# Patient Record
Sex: Female | Born: 1940 | Race: White | Hispanic: No | Marital: Married | Smoking: Current every day smoker
Health system: Southern US, Community
[De-identification: ages and names within clinical notes are randomized; demographics above are authoritative.]

## PROBLEM LIST (undated history)

## (undated) DIAGNOSIS — J449 Chronic obstructive pulmonary disease, unspecified: Secondary | ICD-10-CM

## (undated) DIAGNOSIS — I10 Essential (primary) hypertension: Secondary | ICD-10-CM

## (undated) DIAGNOSIS — M1990 Unspecified osteoarthritis, unspecified site: Secondary | ICD-10-CM

## (undated) DIAGNOSIS — E785 Hyperlipidemia, unspecified: Secondary | ICD-10-CM

## (undated) DIAGNOSIS — J45909 Unspecified asthma, uncomplicated: Secondary | ICD-10-CM

## (undated) HISTORY — PX: BACK SURGERY: SHX140

## (undated) HISTORY — PX: NECK SURGERY: SHX720

## (undated) HISTORY — PX: CHOLECYSTECTOMY: SHX55

## (undated) HISTORY — DX: Unspecified asthma, uncomplicated: J45.909

## (undated) HISTORY — PX: ABDOMINAL HYSTERECTOMY: SHX81

## (undated) HISTORY — DX: Hyperlipidemia, unspecified: E78.5

## (undated) HISTORY — PX: APPENDECTOMY: SHX54

## (undated) HISTORY — DX: Essential (primary) hypertension: I10

---

## 2004-03-24 ENCOUNTER — Emergency Department: Payer: Self-pay | Admitting: Emergency Medicine

## 2004-08-09 ENCOUNTER — Ambulatory Visit: Payer: Self-pay | Admitting: Family Medicine

## 2004-11-04 ENCOUNTER — Ambulatory Visit: Payer: Self-pay | Admitting: Family Medicine

## 2005-05-25 ENCOUNTER — Other Ambulatory Visit: Payer: Self-pay

## 2005-05-25 ENCOUNTER — Ambulatory Visit: Payer: Self-pay | Admitting: General Surgery

## 2005-05-29 ENCOUNTER — Ambulatory Visit: Payer: Self-pay | Admitting: General Surgery

## 2005-12-29 ENCOUNTER — Other Ambulatory Visit: Payer: Self-pay

## 2005-12-29 ENCOUNTER — Inpatient Hospital Stay: Payer: Self-pay | Admitting: Internal Medicine

## 2005-12-30 ENCOUNTER — Other Ambulatory Visit: Payer: Self-pay

## 2006-03-12 ENCOUNTER — Ambulatory Visit: Payer: Self-pay | Admitting: Ophthalmology

## 2006-03-19 ENCOUNTER — Ambulatory Visit: Payer: Self-pay | Admitting: Ophthalmology

## 2006-06-07 ENCOUNTER — Ambulatory Visit: Payer: Self-pay | Admitting: Family Medicine

## 2006-09-17 ENCOUNTER — Ambulatory Visit: Payer: Self-pay | Admitting: Family Medicine

## 2007-09-19 ENCOUNTER — Ambulatory Visit: Payer: Self-pay | Admitting: Family Medicine

## 2008-05-10 ENCOUNTER — Emergency Department: Payer: Self-pay

## 2008-05-25 ENCOUNTER — Ambulatory Visit: Payer: Self-pay | Admitting: Family Medicine

## 2009-05-06 ENCOUNTER — Ambulatory Visit: Payer: Self-pay | Admitting: Otolaryngology

## 2009-09-23 ENCOUNTER — Ambulatory Visit: Payer: Self-pay | Admitting: Family Medicine

## 2011-04-14 IMAGING — CR DG CHEST 2V
1 series · 2 of 2 positions shown · non-contrast
Comparison: none

REASON FOR EXAM: cough
COMMENTS:

PROCEDURE:     DXR - DXR CHEST PA (OR AP) AND LATERAL  - May 10, 2008  [DATE]
RESULT:     No acute cardiopulmonary disease is noted. COPD cannot be
excluded.

[Series 1: view not recorded · 0.17mm/px · 2 of 2 slices shown]
[im 1/2]
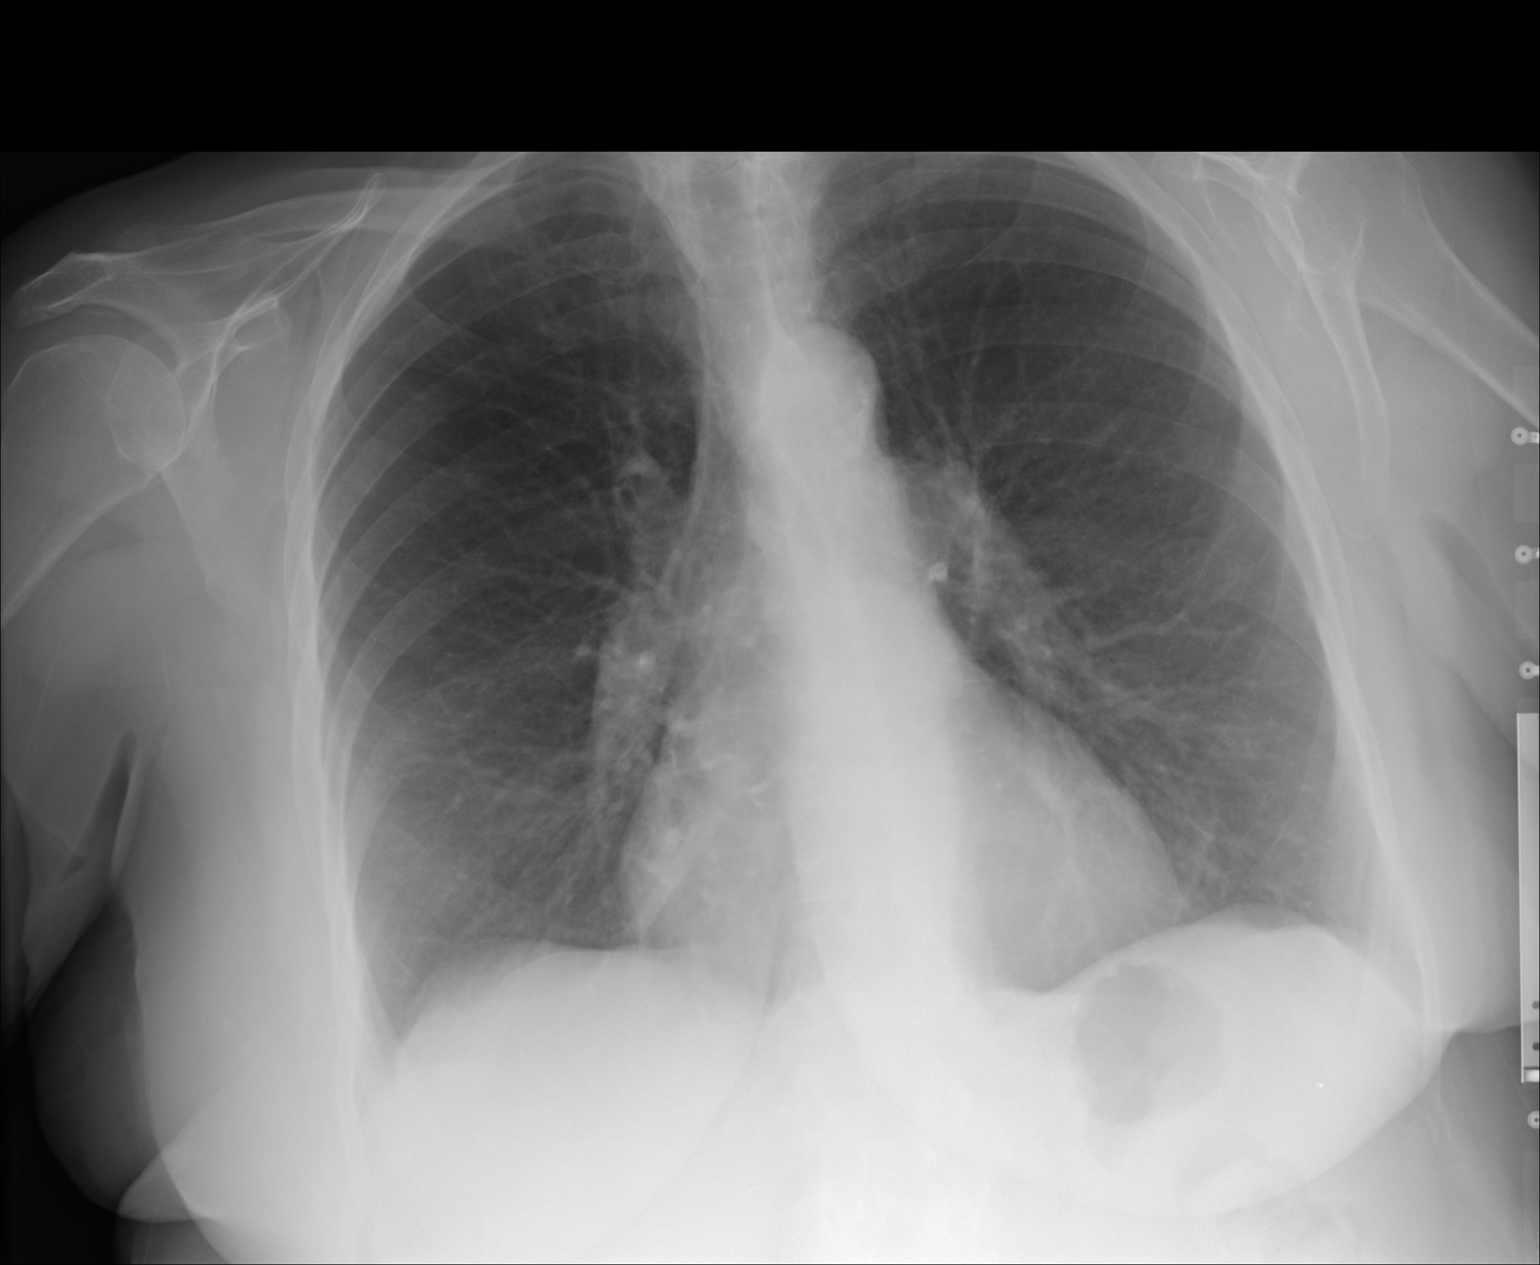
[im 2/2]
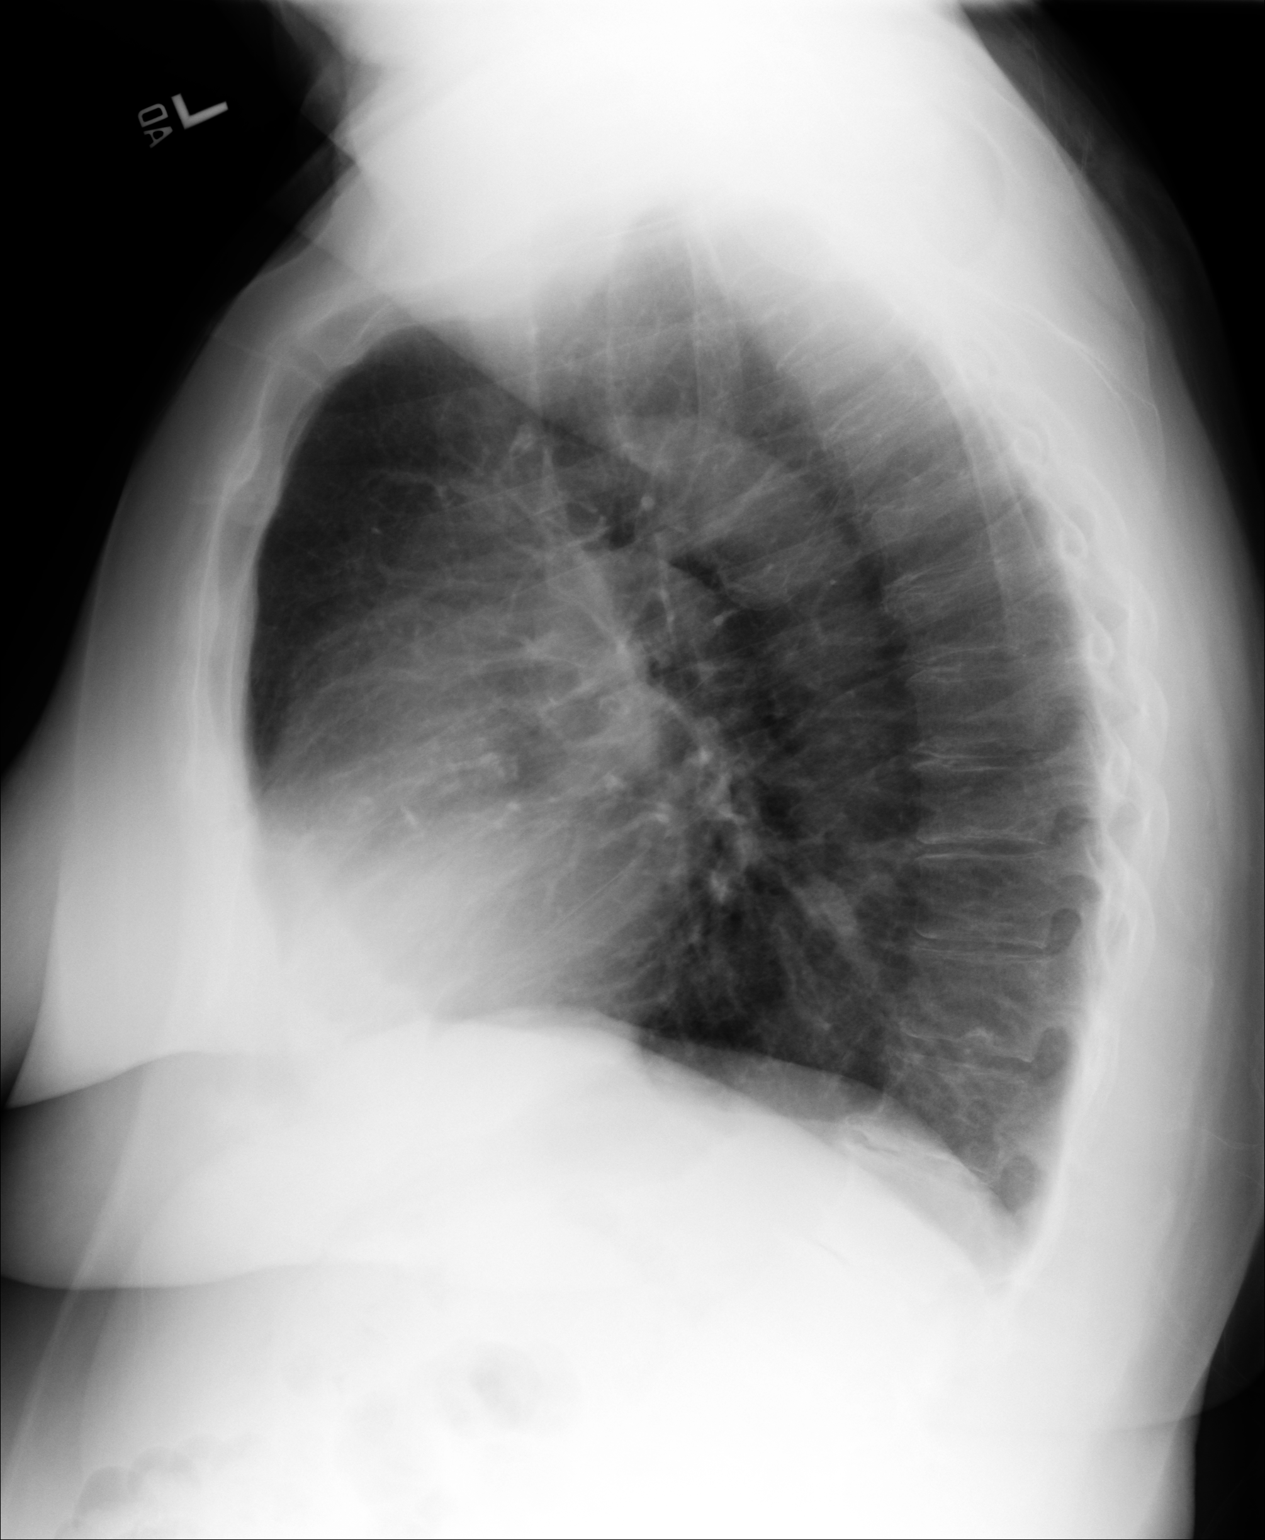

[2 of 2 positions shown; findings below may reference images not displayed]

IMPRESSION: 1.     COPD cannot be excluded.
2.     No acute cardiopulmonary disease. The chest is stable from
12/29/2005.

## 2012-05-02 ENCOUNTER — Ambulatory Visit: Payer: Self-pay | Admitting: Ophthalmology

## 2012-05-02 DIAGNOSIS — I10 Essential (primary) hypertension: Secondary | ICD-10-CM

## 2012-05-13 ENCOUNTER — Ambulatory Visit: Payer: Self-pay | Admitting: Ophthalmology

## 2013-01-07 ENCOUNTER — Ambulatory Visit: Payer: Self-pay | Admitting: Family Medicine

## 2013-04-10 ENCOUNTER — Ambulatory Visit: Payer: Self-pay | Admitting: Family Medicine

## 2013-06-13 ENCOUNTER — Other Ambulatory Visit: Payer: Self-pay | Admitting: Ophthalmology

## 2013-06-13 LAB — CBC WITH DIFFERENTIAL/PLATELET
BASOS ABS: 0.1 10*3/uL (ref 0.0–0.1)
Basophil %: 1.1 %
EOS ABS: 0.3 10*3/uL (ref 0.0–0.7)
EOS PCT: 4.4 %
HCT: 45.2 % (ref 35.0–47.0)
HGB: 15 g/dL (ref 12.0–16.0)
Lymphocyte #: 3.1 10*3/uL (ref 1.0–3.6)
Lymphocyte %: 39.5 %
MCH: 33.6 pg (ref 26.0–34.0)
MCHC: 33.2 g/dL (ref 32.0–36.0)
MCV: 101 fL — AB (ref 80–100)
Monocyte #: 0.9 x10 3/mm (ref 0.2–0.9)
Monocyte %: 11.7 %
NEUTROS ABS: 3.4 10*3/uL (ref 1.4–6.5)
NEUTROS PCT: 43.3 %
RBC: 4.45 10*6/uL (ref 3.80–5.20)
RDW: 14.5 % (ref 11.5–14.5)
WBC: 7.9 10*3/uL (ref 3.6–11.0)

## 2013-06-13 LAB — SEDIMENTATION RATE: Erythrocyte Sed Rate: 6 mm/hr (ref 0–30)

## 2013-12-27 ENCOUNTER — Ambulatory Visit: Payer: Self-pay | Admitting: Family Medicine

## 2014-01-01 ENCOUNTER — Emergency Department: Payer: Self-pay | Admitting: Emergency Medicine

## 2014-02-18 ENCOUNTER — Ambulatory Visit: Payer: Self-pay | Admitting: Internal Medicine

## 2014-03-21 ENCOUNTER — Emergency Department: Payer: Self-pay | Admitting: Internal Medicine

## 2014-05-15 NOTE — Op Note (Signed)
PATIENT NAME:  Christine Mcclain, Christine Mcclain MR#:  [id number] DATE OF BIRTH:  10/28/1940  DATE OF SURGERY:  05/13/2012  PREOPERATIVE DIAGNOSIS: Cataract, left eye.   POSTOPERATIVE DIAGNOSIS: Cataract, left eye.   PROCEDURE PERFORMED: Extracapsular cataract extraction using phacoemulsification with placement of an Alcon [id number], 22.5-diopter posterior chamber lens, serial [id number].   SURGEON: Maylon PeppersSteven A. Airyana Sprunger, M.D.   ANESTHESIA: 4% lidocaine, 0.75% Marcaine, a 50:50 mixture with 10 units/mL of Hylenex added given as a peribulbar.   ANESTHESIOLOGIST: Dr. Dimple Caseyice.   COMPLICATIONS: None.   ESTIMATED BLOOD LOSS: Less than 1 mL.   COMPLICATIONS:  None.  DESCRIPTION OF PROCEDURE:  The patient was brought to the operating room and given a peribulbar block.  The patient was then prepped and draped in the usual fashion.  The vertical rectus muscles were imbricated using 5-0 silk sutures.  These sutures were then clamped to the sterile drapes as bridle sutures.  A limbal peritomy was performed extending 2 clock-hours and hemostasis was obtained with cautery.  A partial-thickness scleral groove was made at the surgical limbus and dissected anteriorly in a lamellar dissection using an Alcon crescent knife.  The anterior chamber was entered supero-temporally with a Superblade and through the lamellar dissection with a 2.6 mm keratome.  DisCoVisc was used to replace the aqueous and a continuous tear capsulorrhexis was carried out.  Hydrodissection and hydrodelineation were carried out with balanced salt and a 27-gauge cannula.  The nucleus was rotated to confirm the effectiveness of the hydrodissection.  Phacoemulsification was carried out using a divide-and-conquer technique.  Ultrasound time was 2 minutes and 29 seconds, with an average power of 26.1%, CDE 69.26. No sutures placed. An AcrySert delivery system was used instead of a Par[employer]ic[employer]arch shooter.   Irrigation/aspiration was used to remove the residual cortex.   DisCoVisc was used to inflate the capsule and the internal incision was enlarged to 3 mm with the crescent knife.  The intraocular lens was folded and inserted into the capsular bag using the AcrySert delivery system instead of a [employer] shooter.  Irrigation/aspiration was used to remove the residual DisCoVisc.  Miostat was injected into the anterior chamber through the paracentesis track to inflate the anterior chamber and induce miosis.  The wound was checked for leaks and none were found. The conjunctiva was closed with cautery and the bridle sutures were removed.  Two drops of 0.3% Vigamox were placed on the eye.   An eye shield was placed on the eye.  The patient was discharged to the recovery room in good condition.    ____________________________ Maylon PeppersSteven A. Eisa Necaise, MD sad:dm D: 05/13/2012 14:17:47 ET Mcclain: 05/13/2012 14:40:54 ET JOB#: [id number]  cc: Viviann SpareSteven A. Geroldine Esquivias, MD, <Dictator> Erline LevineSTEVEN A Yosef Krogh MD ELECTRONICALLY SIGNED 05/20/2012 14:21

## 2015-11-23 ENCOUNTER — Other Ambulatory Visit: Payer: Self-pay | Admitting: Family Medicine

## 2015-11-23 DIAGNOSIS — Z0000 Encounter for general adult medical examination without abnormal findings: Secondary | ICD-10-CM

## 2015-11-23 DIAGNOSIS — M810 Age-related osteoporosis without current pathological fracture: Secondary | ICD-10-CM

## 2015-12-24 ENCOUNTER — Telehealth: Payer: Self-pay | Admitting: *Deleted

## 2015-12-24 NOTE — Telephone Encounter (Signed)
Received referral for low dose lung cancer screening CT scan. Attempted to leave Voicemail at phone number listed in EMR for patient to call me back to facilitate scheduling scan. However, no voicemail option is available.

## 2015-12-28 ENCOUNTER — Telehealth: Payer: Self-pay | Admitting: *Deleted

## 2015-12-28 ENCOUNTER — Encounter: Payer: Self-pay | Admitting: *Deleted

## 2015-12-28 NOTE — Telephone Encounter (Signed)
Received referral for low dose lung cancer screening CT scan. Attempted to leave Voicemail at phone number listed in EMR for patient to call me back to facilitate scheduling scan. However, no voicemail option is available. Letter will be mailed to patient.

## 2016-01-03 ENCOUNTER — Telehealth: Payer: Self-pay | Admitting: *Deleted

## 2016-01-03 NOTE — Telephone Encounter (Signed)
Received referral for initial lung cancer screening scan. Contacted patient and obtained smoking history,(current, 77.5 pack year ) as well as answering questions related to screening process. Patient denies signs of lung cancer such as weight loss or hemoptysis. Patient denies comorbidity that would prevent curative treatment if lung cancer were found. Patient is tentatively scheduled for shared decision making visit and CT scan on 01/11/16 at 1:30pm, pending insurance approval from business office.

## 2016-01-05 ENCOUNTER — Ambulatory Visit: Payer: Medicaid Other

## 2016-01-10 ENCOUNTER — Other Ambulatory Visit: Payer: Self-pay | Admitting: *Deleted

## 2016-01-10 DIAGNOSIS — Z87891 Personal history of nicotine dependence: Secondary | ICD-10-CM

## 2016-01-11 ENCOUNTER — Inpatient Hospital Stay: Payer: Medicare Other | Attending: Oncology | Admitting: Oncology

## 2016-01-11 ENCOUNTER — Ambulatory Visit
Admission: RE | Admit: 2016-01-11 | Discharge: 2016-01-11 | Disposition: A | Discharge status: Home / Self Care | Payer: Medicare Other | Source: Ambulatory Visit | Attending: Oncology | Admitting: Oncology

## 2016-01-11 ENCOUNTER — Encounter: Payer: Self-pay | Admitting: Oncology

## 2016-01-11 DIAGNOSIS — Z87891 Personal history of nicotine dependence: Secondary | ICD-10-CM

## 2016-01-11 DIAGNOSIS — I700 Atherosclerosis of aorta: Secondary | ICD-10-CM | POA: Insufficient documentation

## 2016-01-11 DIAGNOSIS — R590 Localized enlarged lymph nodes: Secondary | ICD-10-CM | POA: Insufficient documentation

## 2016-01-11 DIAGNOSIS — Z122 Encounter for screening for malignant neoplasm of respiratory organs: Secondary | ICD-10-CM

## 2016-01-11 DIAGNOSIS — J439 Emphysema, unspecified: Secondary | ICD-10-CM | POA: Diagnosis not present

## 2016-01-11 DIAGNOSIS — F17210 Nicotine dependence, cigarettes, uncomplicated: Secondary | ICD-10-CM

## 2016-01-11 DIAGNOSIS — I2510 Atherosclerotic heart disease of native coronary artery without angina pectoris: Secondary | ICD-10-CM | POA: Insufficient documentation

## 2016-01-12 ENCOUNTER — Telehealth: Payer: Self-pay | Admitting: *Deleted

## 2016-01-12 NOTE — Telephone Encounter (Signed)
error 

## 2016-01-13 ENCOUNTER — Telehealth: Payer: Self-pay | Admitting: *Deleted

## 2016-01-13 NOTE — Telephone Encounter (Signed)
Notified patient of LDCT lung cancer screening results with recommendation for 12 month follow up imaging. Also notified of incidental finding noted below. Encouraged patient to expect PET scan as well as to discuss CAD with PCP to assess for need of further intervention at this time. Patient verbalizes understanding. This note will be sent to PCP via Epic.  IMPRESSION: 1. Lung-RADS Category 1s, negative. Continue annual screening with low-dose chest CT without contrast in 12 months. 2. The S modifier above refers to an indeterminate soft tissue mass within the anterior mediastinum. Recommend further investigation with PET-CT to assess for any hypermetabolism in this abnormality. 3. Enlarged right paratracheal lymph node. This can also be addressed at PET-CT. 4. Emphysema 5. Aortic atherosclerosis and multi vessel coronary artery calcification.

## 2016-01-15 DIAGNOSIS — Z87891 Personal history of nicotine dependence: Secondary | ICD-10-CM | POA: Insufficient documentation

## 2016-01-15 NOTE — Progress Notes (Signed)
In accordance with CMS guidelines, patient has met eligibility criteria including age, absence of signs or symptoms of lung cancer.  Social History  Substance Use Topics  . Smoking status: Current Every Day Smoker    Packs/day: 1.25    Years: 62.00    Types: Cigarettes  . Smokeless tobacco: Not on file  . Alcohol use Not on file     A shared decision-making session was conducted prior to the performance of CT scan. This includes one or more decision aids, includes benefits and harms of screening, follow-up diagnostic testing, over-diagnosis, false positive rate, and total radiation exposure.  Counseling on the importance of adherence to annual lung cancer LDCT screening, impact of co-morbidities, and ability or willingness to undergo diagnosis and treatment is imperative for compliance of the program.  Counseling on the importance of continued smoking cessation for former smokers; the importance of smoking cessation for current smokers, and information about tobacco cessation interventions have been given to patient including [place] and [facility] quit [place] programs.  Written order for lung cancer screening with LDCT has been given to the patient and any and all questions have been answered to the best of my abilities.   Yearly follow up will be coordinated by Burgess Estelle, Thoracic Navigator.

## 2016-01-28 ENCOUNTER — Other Ambulatory Visit: Payer: Self-pay | Admitting: Family Medicine

## 2016-01-28 DIAGNOSIS — R9389 Abnormal findings on diagnostic imaging of other specified body structures: Secondary | ICD-10-CM

## 2016-01-28 DIAGNOSIS — R599 Enlarged lymph nodes, unspecified: Secondary | ICD-10-CM

## 2016-02-16 ENCOUNTER — Ambulatory Visit
Admission: RE | Admit: 2016-02-16 | Discharge: 2016-02-16 | Disposition: A | Discharge status: Home / Self Care | Payer: Medicare Other | Source: Ambulatory Visit | Attending: Family Medicine | Admitting: Family Medicine

## 2016-02-16 DIAGNOSIS — M419 Scoliosis, unspecified: Secondary | ICD-10-CM | POA: Insufficient documentation

## 2016-02-16 DIAGNOSIS — J432 Centrilobular emphysema: Secondary | ICD-10-CM | POA: Insufficient documentation

## 2016-02-16 DIAGNOSIS — I2510 Atherosclerotic heart disease of native coronary artery without angina pectoris: Secondary | ICD-10-CM | POA: Insufficient documentation

## 2016-02-16 DIAGNOSIS — N281 Cyst of kidney, acquired: Secondary | ICD-10-CM | POA: Insufficient documentation

## 2016-02-16 DIAGNOSIS — R918 Other nonspecific abnormal finding of lung field: Secondary | ICD-10-CM | POA: Insufficient documentation

## 2016-02-16 DIAGNOSIS — R599 Enlarged lymph nodes, unspecified: Secondary | ICD-10-CM | POA: Diagnosis present

## 2016-02-16 DIAGNOSIS — I700 Atherosclerosis of aorta: Secondary | ICD-10-CM | POA: Insufficient documentation

## 2016-02-16 DIAGNOSIS — R9389 Abnormal findings on diagnostic imaging of other specified body structures: Secondary | ICD-10-CM

## 2016-02-16 LAB — GLUCOSE, CAPILLARY: Glucose-Capillary: 106 mg/dL — ABNORMAL HIGH (ref 65–99)

## 2016-02-16 MED ORDER — FLUDEOXYGLUCOSE F - 18 (FDG) INJECTION
12.8900 | Freq: Once | INTRAVENOUS | Status: AC | PRN
  Administered 2016-02-16: 12.89 via INTRAVENOUS

## 2016-02-21 ENCOUNTER — Other Ambulatory Visit: Payer: Medicaid Other

## 2016-02-21 ENCOUNTER — Ambulatory Visit: Payer: Medicaid Other | Attending: Family Medicine

## 2016-02-22 ENCOUNTER — Other Ambulatory Visit: Payer: Self-pay | Admitting: *Deleted

## 2016-03-01 NOTE — Progress Notes (Signed)
[facility] [place] [facility] Medicine Consultation      Assessment and Plan:  Lung Mass.  --Right paratracheal lymphadenopathy suspicious for malignancy, no other suspicious lesions seen. This could represent a significant malignancy or indolent malignancy such as CLL.  --Given the presence of severe emphysema and cardiomyopathy, advanced age, continued smoking, oxygen dependence, deconditioning she would be at moderate to high risk for EBUS bronchoscopy. She would also require cardiac clearance beforehand. She would also need to not smoke for the procedure, which she is not willing to do.  --Given presence of above problems I would expect that her life expectancy would be limited, and therefore not certain if she would benefit from diagnosis or treatment of potential cancer. --Discussed possible options of discontinuation screening vs. Continued screening with re-evaluation of biopsy if lesion persists. She prefers to "enjoy my life" and continue smoking, I think that she can discontinue CT lung cancer screening, at this time and she can always reconsider in the future.   COPD/Emphysema.  --Continue current management and follow up with Dr. Meredeth Ide.   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  --continue management per cardiology.   Pulmonary hypertension.  --I suspect that this is secondary to the patient's systolic CHF, hypoxia, emphysema.   Nicotine Abuse.  --Spent > 10 min in smoking cessation discussion.    Date: 03/01/2016  MRN# [id number] KENDRA GRISSETT [birth year]  Referring Physician: Dr. Laural Roes KEIR FOLAND is a 76 y.o. old female seen in consultation for chief complaint of:    Chief Complaint  Patient presents with  . Advice Only    pain from LT side of breast to center of chest: SOB w/exertion: prod cough w/yellow mucus    HPI:  Rayvn Rickerson is a 76 y.o. Female smoker who was seen by Dr. Meredeth Ide on 05/29/14 for COPD. She presents to clinic for lung mass that was found on CT lung cancer  screening.  Her EF is less than 30 %, copd/emphysema, severely elevated RVSP on echo (73 mmHg) and valvular insufficiency.   Screening CT 01/11/16 showed mildly lobulated anterior mediastinal mass; and a paratracheal mass of 1.5 cm. The patient was sent for a PET scan on 02/16/16; this showed increased uptake of 7.8 in the paratracheal mass, without other significant suspicious findings.  Review of CXR imaging 01/01/14; hyperinflation c/w severe emphysema.   She lives with her husband and son, she notes that some days that she can not walk out to her mailbox because of dyspnea. She does most housework, but some times it take "couple of days to get it done" because of dyspnea.  She is smoking 1.5 ppd and is not going to quit, she has been smoking since the age of 23.  She tells me that surgery is not an option, though chemo and radiation is not an option.    Baseline oxygen sat at rest on RA was 92% and HR 71 walked 350 feet at slow pace with moderate dyspnea, sat was 91% and HR 91 at end.   Echo 01/27/14 at [facility];  INTERPRETATION SEVERE LV SYSTOLIC DYSFUNCTION WITH AN ESTIMATED EF = 25-30 % NORMAL RIGHT VENTRICULAR SYSTOLIC FUNCTION SEVERE MITRAL VALVE INSUFFICIENCY MODERATE TRICUSPID VALVE INSUFFICIENCY TRIVIAL AORTIC VALVE INSUFFICIENCY NO VALVULAR STENOSIS MODERATE BIATRIAL ENLARGEMENT MILD LV ENLARGEMENT DIASTOLIC DYSFUNCTION (GRADE II) MODERATE-TO-SEVERE PULMONARY HYPERTENSION WITH AN ESTIMATED RVSP = 73 mmHg    PMHX:   Past Medical History:  Diagnosis Date  . Asthma   . Hyperlipidemia   . Hypertension  Surgical Hx:  Past Surgical History:  Procedure Laterality Date  . ABDOMINAL HYSTERECTOMY    . APPENDECTOMY    . BACK SURGERY    . CHOLECYSTECTOMY    . NECK SURGERY     Family Hx:  Family History  Problem Relation Age of Onset  . Diabetes Mother   . Hyperlipidemia Father   . Hypertension Father   . Heart disease Father   . Emphysema Sister    Social  Hx:   Social History  Substance Use Topics  . Smoking status: Current Every Day Smoker    Packs/day: 1.25    Years: 62.00    Types: Cigarettes  . Smokeless tobacco: Not on file  . Alcohol use Not on file   Medication:     Reviewed.   Allergies:  Gabapentin; Penicillins; and Codeine  Review of Systems: Gen:  Denies  fever, sweats, chills HEENT: Denies blurred vision, double vision. bleeds, sore throat Cvc:  No dizziness, chest pain. Resp:   Denies hemoptysis.  Gi: Denies swallowing difficulty, stomach pain. Gu:  Denies bladder incontinence, burning urine Ext:   No Joint pain, stiffness. Skin: No skin rash,  hives  Endoc:  No polyuria, polydipsia. Psych: No depression, insomnia. Other:  All other systems were reviewed with the patient and were negative other that what is mentioned in the HPI.   Physical Examination:   VS: BP 128/78 (BP Location: Left Arm, Cuff Size: Normal)   Pulse 72   Wt 152 lb (68.9 kg)   SpO2 92%   BMI 26.93 kg/m   General Appearance: No distress; significant kyphosis.  Neuro:without focal findings,  speech normal,  HEENT: PERRLA, EOM intact.   Pulmonary: normal breath sounds, No wheezing. Decreased air entry bilaterally.  CardiovascularNormal S1,S2.  No m/r/g.   Abdomen: Benign, Soft, non-tender. Renal:  No costovertebral tenderness  GU:  No performed at this time. Endoc: No evident thyromegaly, no signs of acromegaly. Skin:   warm, no rashes, no ecchymosis  Extremities: normal, no cyanosis, clubbing.  Other findings:    LABORATORY PANEL:   CBC No results for input(s): WBC, HGB, HCT, PLT in the last 168 hours. ------------------------------------------------------------------------------------------------------------------  Chemistries  No results for input(s): NA, K, CL, CO2, GLUCOSE, BUN, CREATININE, CALCIUM, MG, AST, ALT, ALKPHOS, BILITOT in the last 168 hours.  Invalid input(s):  GFRCGP ------------------------------------------------------------------------------------------------------------------  Cardiac Enzymes No results for input(s): TROPONINI in the last 168 hours. ------------------------------------------------------------  RADIOLOGY:  No results found.     Thank  you for the consultation and for allowing [facility] to assist in the care of your patient. Our recommendations are noted above.  Please contact us if we can be of further service.   Wells Guileseep Evoleth Nordmeyer, MD.  Board Certified in Internal Medicine, Pulmonary Medicine, Critical Care Medicine, and Sleep Medicine.  [facility] Office Number: [phone]  Santiago Gladavid Kasa, M.D.  Stephanie AcreVishal Mungal, M.D.  Billy Fischeravid Simonds, M.D  03/01/2016

## 2016-03-02 ENCOUNTER — Ambulatory Visit (INDEPENDENT_AMBULATORY_CARE_PROVIDER_SITE_OTHER): Payer: Medicare Other | Admitting: Internal Medicine

## 2016-03-02 ENCOUNTER — Encounter: Payer: Self-pay | Admitting: Internal Medicine

## 2016-03-02 VITALS — BP 128/78 | HR 72 | Wt 152.0 lb

## 2016-03-02 DIAGNOSIS — F17210 Nicotine dependence, cigarettes, uncomplicated: Secondary | ICD-10-CM | POA: Diagnosis not present

## 2016-03-02 DIAGNOSIS — J438 Other emphysema: Secondary | ICD-10-CM | POA: Diagnosis not present

## 2016-03-02 DIAGNOSIS — R590 Localized enlarged lymph nodes: Secondary | ICD-10-CM | POA: Diagnosis not present

## 2016-03-02 NOTE — Patient Instructions (Signed)
--  Continue advair.   --I would recommend that you not continue CT lung cancer screening while you are still smoking. You can always reconsider in the future.   --Quitting smoking is the most important thing that you can do for your health.  --Quitting smoking will have greater affect on your health than any medicine that we can give you.

## 2017-02-23 DEATH — deceased

## 2017-04-05 ENCOUNTER — Emergency Department
Admission: EM | Admit: 2017-04-05 | Discharge: 2017-04-05 | Disposition: A | Discharge status: Home / Self Care | Payer: Medicare Other | Attending: Emergency Medicine | Admitting: Emergency Medicine

## 2017-04-05 ENCOUNTER — Other Ambulatory Visit: Payer: Self-pay

## 2017-04-05 ENCOUNTER — Emergency Department: Payer: Medicare Other

## 2017-04-05 ENCOUNTER — Emergency Department: Payer: Self-pay

## 2017-04-05 DIAGNOSIS — M542 Cervicalgia: Secondary | ICD-10-CM | POA: Insufficient documentation

## 2017-04-05 DIAGNOSIS — Z79899 Other long term (current) drug therapy: Secondary | ICD-10-CM | POA: Diagnosis not present

## 2017-04-05 DIAGNOSIS — R079 Chest pain, unspecified: Secondary | ICD-10-CM

## 2017-04-05 DIAGNOSIS — F17210 Nicotine dependence, cigarettes, uncomplicated: Secondary | ICD-10-CM | POA: Insufficient documentation

## 2017-04-05 DIAGNOSIS — J45909 Unspecified asthma, uncomplicated: Secondary | ICD-10-CM | POA: Diagnosis not present

## 2017-04-05 DIAGNOSIS — I10 Essential (primary) hypertension: Secondary | ICD-10-CM | POA: Diagnosis not present

## 2017-04-05 DIAGNOSIS — R200 Anesthesia of skin: Secondary | ICD-10-CM | POA: Diagnosis not present

## 2017-04-05 DIAGNOSIS — M50122 Cervical disc disorder at C5-C6 level with radiculopathy: Secondary | ICD-10-CM | POA: Diagnosis not present

## 2017-04-05 DIAGNOSIS — M25512 Pain in left shoulder: Secondary | ICD-10-CM | POA: Diagnosis present

## 2017-04-05 DIAGNOSIS — Z7982 Long term (current) use of aspirin: Secondary | ICD-10-CM | POA: Insufficient documentation

## 2017-04-05 DIAGNOSIS — J449 Chronic obstructive pulmonary disease, unspecified: Secondary | ICD-10-CM | POA: Diagnosis not present

## 2017-04-05 HISTORY — DX: Chronic obstructive pulmonary disease, unspecified: J44.9

## 2017-04-05 HISTORY — DX: Unspecified osteoarthritis, unspecified site: M19.90

## 2017-04-05 MED ORDER — METHYLPREDNISOLONE 4 MG PO TBPK: ORAL_TABLET | ORAL | 0 refills | Status: DC

## 2017-04-05 MED ORDER — TRAMADOL HCL 50 MG PO TABS
50.0000 mg | ORAL_TABLET | Freq: Once | ORAL | Status: AC
  Administered 2017-04-05: 50 mg via ORAL
  Filled 2017-04-05: qty 1

## 2017-04-05 NOTE — ED Triage Notes (Signed)
[facility] for left shoulder and neck pain the numbness in the left 4th and 5th fingers in to the medial arm for the past 5 days. States this started after moving a large load of clothes, began having a lot of pain with movement the next morning and is having issues with simple task of reaching above her head..Christine Mcclain

## 2017-04-05 NOTE — ED Notes (Signed)
See triage note.states she developed pain to left shoulder after trying to pick up her sone  Pain radiates into posterior shoulder blade   No deformity note  Pain increased with movement

## 2017-04-05 NOTE — ED Notes (Signed)
First Nurse Note:  [place], states she has back pain X 4-5 days.  Patient states "he thinks I might have had a stroke".  Alert and oriented.  Speech clear.  Placed in WC.

## 2017-04-05 NOTE — ED Provider Notes (Signed)
[facility] Emergency Department Provider Note   ____________________________________________   First MD Initiated Contact with Patient 04/05/17 1032     (approximate)  I have reviewed the triage vital signs and the nursing notes.   HISTORY  Chief Complaint Shoulder Pain    HPI Christine Mcclain is a 77 y.o. female patient sent from [facility] urgent care clinic for further evaluation of neck and left shoulder pain.  Patient does state numbness into the fourth and fifth fingers of the left hand.  Onset of complaint has increased in the past 5 days status post  movement large load of clothing.  Patient rates the pain as 8/10.  No palliative measured for complaint.  Past Medical History:  Diagnosis Date  . Arthritis   . Asthma   . COPD (chronic obstructive pulmonary disease) (HCC)   . Hyperlipidemia   . Hypertension     Patient Active Problem List   Diagnosis Date Noted  . Personal history of tobacco use, presenting hazards to health 01/15/2016    Past Surgical History:  Procedure Laterality Date  . ABDOMINAL HYSTERECTOMY    . APPENDECTOMY    . BACK SURGERY    . CHOLECYSTECTOMY    . NECK SURGERY      Prior to Admission medications   Medication Sig Start Date End Date Taking? Authorizing Provider  alendronate (FOSAMAX) 70 MG tablet Take 70 mg by mouth once a week.    [provider]  aspirin 81 MG chewable tablet Chew 81 mg by mouth daily.    [provider]  atenolol (TENORMIN) 50 MG tablet Take 50 mg by mouth daily.    [provider]  Fluticasone-Salmeterol (ADVAIR DISKUS) 250-50 MCG/DOSE AEPB Inhale 1 puff into the lungs 2 (two) times daily.    [provider]  furosemide (LASIX) 40 MG tablet Take 40 mg by mouth daily.    [provider]  methylPREDNISolone (MEDROL DOSEPAK) 4 MG TBPK tablet Take Tapered dose as directed 04/05/17   Joni ReiningSmith, Christeena Krogh K, PA-C  POTASSIUM CHLORIDE ER PO Take 1 tablet by mouth  daily.    [provider]  ranitidine (ZANTAC) 150 MG tablet Take 150 mg by mouth daily.    [provider]  rOPINIRole (REQUIP) 0.25 MG tablet Take 0.25 mg by mouth 2 (two) times daily.    [provider]  simvastatin (ZOCOR) 20 MG tablet Take 40 mg by mouth daily.    [provider]  tiotropium (SPIRIVA) 18 MCG inhalation capsule Place 1 capsule into inhaler and inhale daily.    [provider]    Allergies Gabapentin; Naproxen; Penicillins; and Codeine  Family History  Problem Relation Age of Onset  . Diabetes Mother   . Hyperlipidemia Father   . Hypertension Father   . Heart disease Father   . Emphysema Sister     Social History Social History   Tobacco Use  . Smoking status: Current Every Day Smoker    Packs/day: 1.50    Years: 62.00    Pack years: 93.00    Types: Cigarettes  . Smokeless tobacco: Never Used  Substance Use Topics  . Alcohol use: No  . Drug use: No    Review of Systems Constitutional: No fever/chills Eyes: No visual changes. ENT: No sore throat. Cardiovascular: Denies chest pain. Respiratory: Denies shortness of breath. Gastrointestinal: No abdominal pain.  No nausea, no vomiting.  No diarrhea.  No constipation. Genitourinary: Negative for dysuria. Musculoskeletal: Positive for  neck and left shoulder pain. Skin: Negative for rash. Neurological: Negative for headaches, focal weakness or numbness. Endocrine:Hyperlipidemia and hypertension Allergic/Immunilogical: See medication list ____________________________________________   PHYSICAL EXAM:  VITAL SIGNS: ED Triage Vitals  Enc Vitals Group     BP 04/05/17 1006 136/64     Pulse Rate 04/05/17 1006 (!) 58     Resp 04/05/17 1006 17     Temp 04/05/17 1006 (!) 97.4 F (36.3 C)     Temp Source 04/05/17 1006 Oral     SpO2 04/05/17 1006 96 %     Weight 04/05/17 1007 132 lb (59.9 kg)     Height 04/05/17 1007 5\' 2"  (1.575 m)     Head Circumference --       Peak Flow --      Pain Score 04/05/17 1007 8     Pain Loc --      Pain Edu? --      Excl. in GC? --    Constitutional: Alert and oriented. Well appearing and in no acute distress. Eyes: Conjunctivae are normal. PERRL. EOMI. Head: Atraumatic. Nose: No congestion/rhinnorhea. Mouth/Throat: Mucous membranes are moist.  Oropharynx non-erythematous. Neck: No stridor.  cervical spine guarding to palpation C4 through C6. Hematological/Lymphatic/Immunilogical: No cervical lymphadenopathy. Cardiovascular: Normal rate, regular rhythm. Grossly normal heart sounds.  Good peripheral circulation. Respiratory: Normal respiratory effort.  No retractions. Lungs CTAB. Musculoskeletal: No obvious deformity to the left shoulder.  Patient has moderate guarding palpation to [facility] joint.  Decreased range of motion with abduction overhead reaching. Neurologic:  Normal speech and language. No gross focal neurologic deficits are appreciated. No gait instability. Skin:  Skin is warm, dry and intact. No rash noted. Psychiatric: Mood and affect are normal. Speech and behavior are normal.  ____________________________________________   LABS (all labs ordered are listed, but only abnormal results are displayed)  Labs Reviewed - No data to display ____________________________________________  EKG   ____________________________________________  RADIOLOGY  ED MD interpretation:    Official radiology report(s): Dg Cervical Spine Complete  Result Date: 04/05/2017 CLINICAL DATA:  Left shoulder neck pain with numbness to the left fourth and fifth digits. EXAM: CERVICAL SPINE - COMPLETE 4+ VIEW COMPARISON:  None. FINDINGS: There is no evidence of cervical spine fracture or prevertebral soft tissue swelling. Alignment is normal. There are degenerative joint changes throughout cervical spine with narrowed joint space and osteophyte formation. There are narrowing of the bilateral C3-4, C4-5, C5-6, C6-7 neural  foramina due to osteophyte encroachment. IMPRESSION: Degenerative joint changes of cervical spine. Electronically Signed   By: Sherian Rein M.D.   On: 04/05/2017 11:20   Ct Cervical Spine Wo Contrast  Result Date: 04/05/2017 CLINICAL DATA:  Pt sent from [facility] urgent care for left shoulder and neck pain the numbness in the left 4th and 5th fingers in to the medial arm for the past 5 days. States this started after moving a large load of clothes, began having a lot of pain with movement the next morning and is having issues with simple task of reaching above her head. EXAM: CT CERVICAL SPINE WITHOUT CONTRAST TECHNIQUE: Multidetector CT imaging of the cervical spine was performed without intravenous contrast. Multiplanar CT image reconstructions were also generated. COMPARISON:  Cervical spine radiographs, 04/05/2017 at 10:52 a.m. FINDINGS: Alignment: Mild straightening of the normal cervical lordosis. No spondylolisthesis. Skull base and vertebrae: No acute fracture. No primary bone lesion or focal pathologic process. Soft tissues and spinal canal: No intraspinal mass or convincing disc herniation.  Spondylotic disc bulging leads to mild narrowing of the central spinal canal, greatest at the C4-C5 and C5-C6 levels, with the AP dimension measuring approximately 8-9 mm. No evidence of an epidural hematoma. Disc levels: Disc degenerative changes are noted throughout the cervical spine. There is mild loss of disc height at C2-C3, mild to moderate loss disc height at C3-C4, mild loss of disc height at C4-C5, mild to moderate loss of disc height at C5-C6 and moderate loss disc height at C6-C7 and C7-T1. There are facet degenerative changes bilaterally, most severe on the right at C4-C5 and C5-C6 and on the left at C2-C3 air and C5-C6. Facet spurring and uncovertebral spurring leads to varying degrees of neural foraminal narrowing. On the left, this is greatest at C3-C4, C4-C5 and C5-C6, moderate severity. On the  right this is greatest at C4-C5, moderate to severe in severity. Upper chest: Nodule arises from the inferior right thyroid lobe with some associated calcification. It measures 1.8 cm. No neck base masses or enlarged lymph nodes. There carotid artery vascular calcifications. Lung apices are clear. Other: None. IMPRESSION: 1. No fracture or acute finding. 2. Degenerative changes as described. There is mild central spinal canal narrowing in the upper to mid cervical spine. No evidence of a disc herniation. There is multilevel neural foraminal narrowing due to uncovertebral and facet spurring. Given the left-sided symptoms, the moderate neural foraminal narrowing at C3-C4, C4-C5 and C5-C6 is likely the most clinically significant. 3. 1.8 cm right thyroid nodule. Consider further evaluation with thyroid ultrasound. If patient is clinically hyperthyroid, consider nuclear medicine thyroid uptake and scan. Electronically Signed   By: Amie Portland M.D.   On: 04/05/2017 11:59   Dg Shoulder Left  Result Date: 04/05/2017 CLINICAL DATA:  Left shoulder pain and neck pain. EXAM: LEFT SHOULDER - 2+ VIEW COMPARISON:  None. FINDINGS: There is no evidence of fracture or dislocation. The left humerus is high riding. Soft tissues are unremarkable. IMPRESSION: There is no acute fracture or dislocation. Electronically Signed   By: Sherian Rein M.D.   On: 04/05/2017 11:21    ____________________________________________   PROCEDURES  Procedure(s) performed: None  Procedures  Critical Care performed: No  ____________________________________________   INITIAL IMPRESSION / ASSESSMENT AND PLAN / ED COURSE  As part of my medical decision making, I reviewed the following data within the electronic MEDICAL RECORD NUMBER    Radicular cervical pain to the left upper extremity.  Discussed x-ray findings with patient.  Patient given discharge care instruction and will follow with neurologist at the [facility] clinic for  definitive evaluation and treatment.      ____________________________________________   FINAL CLINICAL IMPRESSION(S) / ED DIAGNOSES  Final diagnoses:  Cervical disc disorder at C5-C6 level with radiculopathy     ED Discharge Orders        Ordered    methylPREDNISolone (MEDROL DOSEPAK) 4 MG TBPK tablet     04/05/17 1243       Note:  This document was prepared using Dragon voice recognition software and may include unintentional dictation errors.    Joni Reining, PA-C 04/05/17 1246    Nita Sickle, MD 04/05/17 [phone]

## 2017-05-31 ENCOUNTER — Other Ambulatory Visit: Payer: Self-pay | Admitting: Family Medicine

## 2017-05-31 DIAGNOSIS — E041 Nontoxic single thyroid nodule: Secondary | ICD-10-CM

## 2017-06-05 ENCOUNTER — Ambulatory Visit: Payer: Medicare Other

## 2017-06-08 ENCOUNTER — Ambulatory Visit: Payer: Medicare Other

## 2017-06-13 ENCOUNTER — Other Ambulatory Visit: Payer: Self-pay | Admitting: Family Medicine

## 2017-06-13 DIAGNOSIS — E041 Nontoxic single thyroid nodule: Secondary | ICD-10-CM

## 2017-06-21 ENCOUNTER — Ambulatory Visit
Admission: RE | Admit: 2017-06-21 | Discharge: 2017-06-21 | Disposition: A | Discharge status: Home / Self Care | Payer: Medicare Other | Source: Ambulatory Visit | Attending: Family Medicine | Admitting: Family Medicine

## 2017-06-21 DIAGNOSIS — E042 Nontoxic multinodular goiter: Secondary | ICD-10-CM | POA: Diagnosis not present

## 2017-06-21 DIAGNOSIS — E041 Nontoxic single thyroid nodule: Secondary | ICD-10-CM | POA: Diagnosis present

## 2017-08-13 ENCOUNTER — Other Ambulatory Visit: Payer: Self-pay | Admitting: Family Medicine

## 2017-08-13 DIAGNOSIS — R599 Enlarged lymph nodes, unspecified: Secondary | ICD-10-CM

## 2017-08-14 ENCOUNTER — Emergency Department: Payer: Medicare Other

## 2017-08-14 ENCOUNTER — Encounter: Payer: Self-pay | Admitting: Emergency Medicine

## 2017-08-14 ENCOUNTER — Observation Stay
Admission: EM | Admit: 2017-08-14 | Discharge: 2017-08-15 | Disposition: A | Discharge status: Home / Self Care | Payer: Medicare Other | Attending: Internal Medicine | Admitting: Internal Medicine

## 2017-08-14 ENCOUNTER — Other Ambulatory Visit: Payer: Self-pay

## 2017-08-14 DIAGNOSIS — J439 Emphysema, unspecified: Secondary | ICD-10-CM | POA: Diagnosis not present

## 2017-08-14 DIAGNOSIS — Z885 Allergy status to narcotic agent status: Secondary | ICD-10-CM | POA: Diagnosis not present

## 2017-08-14 DIAGNOSIS — Z8249 Family history of ischemic heart disease and other diseases of the circulatory system: Secondary | ICD-10-CM | POA: Insufficient documentation

## 2017-08-14 DIAGNOSIS — J90 Pleural effusion, not elsewhere classified: Secondary | ICD-10-CM | POA: Insufficient documentation

## 2017-08-14 DIAGNOSIS — R079 Chest pain, unspecified: Secondary | ICD-10-CM | POA: Diagnosis not present

## 2017-08-14 DIAGNOSIS — Z7951 Long term (current) use of inhaled steroids: Secondary | ICD-10-CM | POA: Diagnosis not present

## 2017-08-14 DIAGNOSIS — I200 Unstable angina: Secondary | ICD-10-CM | POA: Diagnosis present

## 2017-08-14 DIAGNOSIS — Z7983 Long term (current) use of bisphosphonates: Secondary | ICD-10-CM | POA: Diagnosis not present

## 2017-08-14 DIAGNOSIS — Z880 Allergy status to penicillin: Secondary | ICD-10-CM | POA: Insufficient documentation

## 2017-08-14 DIAGNOSIS — F17210 Nicotine dependence, cigarettes, uncomplicated: Secondary | ICD-10-CM | POA: Insufficient documentation

## 2017-08-14 DIAGNOSIS — E785 Hyperlipidemia, unspecified: Secondary | ICD-10-CM | POA: Insufficient documentation

## 2017-08-14 DIAGNOSIS — N390 Urinary tract infection, site not specified: Secondary | ICD-10-CM | POA: Insufficient documentation

## 2017-08-14 DIAGNOSIS — Z79899 Other long term (current) drug therapy: Secondary | ICD-10-CM | POA: Insufficient documentation

## 2017-08-14 DIAGNOSIS — Z833 Family history of diabetes mellitus: Secondary | ICD-10-CM | POA: Insufficient documentation

## 2017-08-14 DIAGNOSIS — I10 Essential (primary) hypertension: Secondary | ICD-10-CM | POA: Insufficient documentation

## 2017-08-14 DIAGNOSIS — M1990 Unspecified osteoarthritis, unspecified site: Secondary | ICD-10-CM | POA: Diagnosis not present

## 2017-08-14 DIAGNOSIS — Z7982 Long term (current) use of aspirin: Secondary | ICD-10-CM | POA: Insufficient documentation

## 2017-08-14 DIAGNOSIS — Z886 Allergy status to analgesic agent status: Secondary | ICD-10-CM | POA: Diagnosis not present

## 2017-08-14 DIAGNOSIS — Z888 Allergy status to other drugs, medicaments and biological substances status: Secondary | ICD-10-CM | POA: Insufficient documentation

## 2017-08-14 LAB — CBC
HEMATOCRIT: 41.8 % (ref 35.0–47.0)
Hemoglobin: 14.3 g/dL (ref 12.0–16.0)
MCH: 33.4 pg (ref 26.0–34.0)
MCHC: 34.2 g/dL (ref 32.0–36.0)
MCV: 97.7 fL (ref 80.0–100.0)
Platelets: ADEQUATE 10*3/uL (ref 150–440)
RBC: 4.27 MIL/uL (ref 3.80–5.20)
RDW: 16.2 % — ABNORMAL HIGH (ref 11.5–14.5)
WBC: 16.3 10*3/uL — ABNORMAL HIGH (ref 3.6–11.0)

## 2017-08-14 LAB — BASIC METABOLIC PANEL WITH GFR
Anion gap: 10 (ref 5–15)
BUN: 23 mg/dL (ref 8–23)
CO2: 32 mmol/L (ref 22–32)
Calcium: 9.1 mg/dL (ref 8.9–10.3)
Chloride: 96 mmol/L — ABNORMAL LOW (ref 98–111)
Creatinine, Ser: 1.08 mg/dL — ABNORMAL HIGH (ref 0.44–1.00)
GFR calc Af Amer: 55 mL/min — ABNORMAL LOW
GFR calc non Af Amer: 48 mL/min — ABNORMAL LOW
Glucose, Bld: 102 mg/dL — ABNORMAL HIGH (ref 70–99)
Potassium: 4.1 mmol/L (ref 3.5–5.1)
Sodium: 138 mmol/L (ref 135–145)

## 2017-08-14 LAB — TROPONIN I
TROPONIN I: 0.03 ng/mL — AB (ref <0.03)
Troponin I: <0.03 ng/mL (ref <0.03)

## 2017-08-14 MED ORDER — ALBUTEROL SULFATE (2.5 MG/3ML) 0.083% IN NEBU
3.0000 mL | INHALATION_SOLUTION | RESPIRATORY_TRACT | Status: DC | PRN
Start: 2017-08-14 — End: 2017-08-15

## 2017-08-14 MED ORDER — LISINOPRIL 5 MG PO TABS
5.0000 mg | ORAL_TABLET | Freq: Every day | ORAL | Status: DC
  Administered 2017-08-15: 5 mg via ORAL
  Filled 2017-08-14: qty 1

## 2017-08-14 MED ORDER — AMMONIUM LACTATE 12 % EX LOTN
TOPICAL_LOTION | Freq: Two times a day (BID) | CUTANEOUS | Status: DC | PRN
  Filled 2017-08-14: qty 400

## 2017-08-14 MED ORDER — METHYLPREDNISOLONE SODIUM SUCC 125 MG IJ SOLR
60.0000 mg | Freq: Once | INTRAMUSCULAR | Status: AC
  Administered 2017-08-14: 60 mg via INTRAVENOUS
  Filled 2017-08-14: qty 2

## 2017-08-14 MED ORDER — ATORVASTATIN CALCIUM 20 MG PO TABS
80.0000 mg | ORAL_TABLET | Freq: Every day | ORAL | Status: DC
  Administered 2017-08-14: 80 mg via ORAL
  Filled 2017-08-14: qty 4

## 2017-08-14 MED ORDER — ASPIRIN 81 MG PO CHEW
324.0000 mg | CHEWABLE_TABLET | ORAL | Status: AC
  Administered 2017-08-14: 324 mg via ORAL
  Filled 2017-08-14: qty 4

## 2017-08-14 MED ORDER — IPRATROPIUM-ALBUTEROL 0.5-2.5 (3) MG/3ML IN SOLN
3.0000 mL | Freq: Once | RESPIRATORY_TRACT | Status: AC
  Administered 2017-08-14: 3 mL via RESPIRATORY_TRACT
  Filled 2017-08-14: qty 3

## 2017-08-14 MED ORDER — CEPHALEXIN 500 MG PO CAPS
500.0000 mg | ORAL_CAPSULE | Freq: Four times a day (QID) | ORAL | Status: DC
  Administered 2017-08-14 – 2017-08-15 (×4): 500 mg via ORAL
  Filled 2017-08-14 (×4): qty 1

## 2017-08-14 MED ORDER — FUROSEMIDE 20 MG PO TABS
20.0000 mg | ORAL_TABLET | Freq: Every day | ORAL | Status: DC
  Administered 2017-08-15: 20 mg via ORAL
  Filled 2017-08-14: qty 1

## 2017-08-14 MED ORDER — NICOTINE 14 MG/24HR TD PT24
14.0000 mg | MEDICATED_PATCH | Freq: Every day | TRANSDERMAL | Status: DC
  Administered 2017-08-14 – 2017-08-15 (×2): 14 mg via TRANSDERMAL
  Filled 2017-08-14 (×2): qty 1

## 2017-08-14 MED ORDER — ACETAMINOPHEN 325 MG PO TABS: 650.0000 mg | ORAL_TABLET | ORAL | Status: DC | PRN

## 2017-08-14 MED ORDER — VITAMIN D3 25 MCG (1000 UNIT) PO TABS
2000.0000 [IU] | ORAL_TABLET | Freq: Every day | ORAL | Status: DC
  Administered 2017-08-15: 2000 [IU] via ORAL
  Filled 2017-08-14: qty 2

## 2017-08-14 MED ORDER — NITROGLYCERIN 0.4 MG SL SUBL: 0.4000 mg | SUBLINGUAL_TABLET | SUBLINGUAL | Status: DC | PRN

## 2017-08-14 MED ORDER — ENOXAPARIN SODIUM 60 MG/0.6ML ~~LOC~~ SOLN
1.0000 mg/kg | Freq: Two times a day (BID) | SUBCUTANEOUS | Status: DC
  Administered 2017-08-14 – 2017-08-15 (×2): 55 mg via SUBCUTANEOUS
  Filled 2017-08-14 (×3): qty 0.6

## 2017-08-14 MED ORDER — NYSTATIN 100000 UNIT/ML MT SUSP
5.0000 mL | Freq: Four times a day (QID) | OROMUCOSAL | Status: DC
  Administered 2017-08-14 – 2017-08-15 (×3): 500000 [IU] via ORAL
  Filled 2017-08-14 (×3): qty 5

## 2017-08-14 MED ORDER — ATENOLOL 50 MG PO TABS
50.0000 mg | ORAL_TABLET | Freq: Every day | ORAL | Status: DC
  Administered 2017-08-15: 50 mg via ORAL
  Filled 2017-08-14: qty 1

## 2017-08-14 MED ORDER — MORPHINE SULFATE (PF) 2 MG/ML IV SOLN: 2.0000 mg | INTRAVENOUS | Status: DC | PRN

## 2017-08-14 MED ORDER — FLUTICASONE PROPIONATE 50 MCG/ACT NA SUSP
2.0000 | Freq: Every day | NASAL | Status: DC
  Administered 2017-08-15: 2 via NASAL
  Filled 2017-08-14: qty 16

## 2017-08-14 MED ORDER — ALENDRONATE SODIUM 70 MG PO TABS: 70.0000 mg | ORAL_TABLET | ORAL | Status: DC

## 2017-08-14 MED ORDER — ASPIRIN EC 81 MG PO TBEC
81.0000 mg | DELAYED_RELEASE_TABLET | Freq: Every day | ORAL | Status: DC
  Administered 2017-08-15: 81 mg via ORAL
  Filled 2017-08-14: qty 1

## 2017-08-14 MED ORDER — FAMOTIDINE 20 MG PO TABS
10.0000 mg | ORAL_TABLET | Freq: Every day | ORAL | Status: DC
  Administered 2017-08-15: 10 mg via ORAL
  Filled 2017-08-14: qty 1

## 2017-08-14 MED ORDER — ONDANSETRON HCL 4 MG/2ML IJ SOLN: 4.0000 mg | Freq: Four times a day (QID) | INTRAMUSCULAR | Status: DC | PRN

## 2017-08-14 MED ORDER — ASPIRIN 300 MG RE SUPP: 300.0000 mg | RECTAL | Status: AC

## 2017-08-14 MED ORDER — MOMETASONE FURO-FORMOTEROL FUM 200-5 MCG/ACT IN AERO
2.0000 | INHALATION_SPRAY | Freq: Two times a day (BID) | RESPIRATORY_TRACT | Status: DC
  Administered 2017-08-14 – 2017-08-15 (×2): 2 via RESPIRATORY_TRACT
  Filled 2017-08-14: qty 8.8

## 2017-08-14 NOTE — H&P (Signed)
[facility] [place] at [facility]   PATIENT NAME: Christine Mcclain    MR#:  [id number]  DATE OF BIRTH:  1940/11/21  DATE OF ADMISSION:  08/14/2017  PRIMARY CARE PHYSICIAN: Hillery Aldo, MD   REQUESTING/REFERRING PHYSICIAN:   CHIEF COMPLAINT:   Chief Complaint  Patient presents with  . Shortness of Breath    HISTORY OF PRESENT ILLNESS: Christine Mcclain  is a 77 y.o. female with a known history of COPD, asthma, arthritis, hyperlipidemia, hypertension presented to the emergency room with chest pain.  Pain has been going on and off for the last 2 weeks.  Located in the left side of the chest radiates to left arm and back first set of troponin is negative.  Patient currently on oral Keflex antibiotic for bladder infection.  Patient follows up with [facility] clinic cardiology.  Hospitalist service was consulted.  PAST MEDICAL HISTORY:   Past Medical History:  Diagnosis Date  . Arthritis   . Asthma   . COPD (chronic obstructive pulmonary disease) (HCC)   . Hyperlipidemia   . Hypertension     PAST SURGICAL HISTORY:  Past Surgical History:  Procedure Laterality Date  . ABDOMINAL HYSTERECTOMY    . APPENDECTOMY    . BACK SURGERY    . CHOLECYSTECTOMY    . NECK SURGERY      SOCIAL HISTORY:  Social History   Tobacco Use  . Smoking status: Current Every Day Smoker    Packs/day: 1.50    Years: 62.00    Pack years: 93.00    Types: Cigarettes  . Smokeless tobacco: Never Used  Substance Use Topics  . Alcohol use: No    FAMILY HISTORY:  Family History  Problem Relation Age of Onset  . Diabetes Mother   . Hyperlipidemia Father   . Hypertension Father   . Heart disease Father   . Emphysema Sister     DRUG ALLERGIES:  Allergies  Allergen Reactions  . Gabapentin Hives  . Naproxen Hives  . Penicillins Hives    Has patient had a PCN reaction causing immediate rash, facial/tongue/throat swelling, SOB or lightheadedness with hypotension: No Has patient had a  PCN reaction causing severe rash involving mucus membranes or skin necrosis: No Has patient had a PCN reaction that required hospitalization: No Has patient had a PCN reaction occurring within the last 10 years: No If all of the above answers are "NO", then may proceed with Cephalosporin use.  . Codeine Rash    REVIEW OF SYSTEMS:   CONSTITUTIONAL: No fever, fatigue or weakness.  EYES: No blurred or double vision.  EARS, NOSE, AND THROAT: No tinnitus or ear pain.  RESPIRATORY: No cough, shortness of breath, wheezing or hemoptysis.  CARDIOVASCULAR: Has chest pain, no orthopnea, edema.  GASTROINTESTINAL: No nausea, vomiting, diarrhea or abdominal pain.  GENITOURINARY: No dysuria, hematuria.  ENDOCRINE: No polyuria, nocturia,  HEMATOLOGY: No anemia, easy bruising or bleeding SKIN: No rash or lesion. MUSCULOSKELETAL: No joint pain or arthritis.   NEUROLOGIC: No tingling, numbness, weakness.  PSYCHIATRY: No anxiety or depression.   MEDICATIONS AT HOME:  Prior to Admission medications   Medication Sig Start Date End Date Taking? Authorizing Provider  albuterol (PROVENTIL) (2.5 MG/3ML) 0.083% nebulizer solution Take 3 mLs by nebulization every 4 (four) hours as needed for wheezing or shortness of breath.    Yes [provider]  alendronate (FOSAMAX) 70 MG tablet Take 70 mg by mouth every Saturday.    Yes [provider]  ammonium lactate (AMLACTIN) 12 % cream Apply topically 2 (two) times daily as needed for dry skin.   Yes [provider]  aspirin EC 81 MG tablet Take 81 mg by mouth daily.   Yes [provider]  atenolol (TENORMIN) 50 MG tablet Take 50 mg by mouth daily.   Yes [provider]  atorvastatin (LIPITOR) 80 MG tablet Take 80 mg by mouth at bedtime.   Yes [provider]  budesonide-formoterol (SYMBICORT) 160-4.5 MCG/ACT inhaler Inhale 2 puffs into the lungs 2 (two) times daily.   Yes [provider]  cephALEXin  (KEFLEX) 500 MG capsule Take 500 mg by mouth daily. 08/09/17  Yes [provider]  cholecalciferol (VITAMIN D) 1000 units tablet Take 2,000 Units by mouth daily.   Yes [provider]  fluticasone (FLONASE) 50 MCG/ACT nasal spray Place 2 sprays into both nostrils daily.   Yes [provider]  furosemide (LASIX) 20 MG tablet Take 20 mg by mouth daily.   Yes [provider]  levofloxacin (LEVAQUIN) 500 MG tablet Take 500 mg by mouth daily. 08/06/17 08/14/17 Yes [provider]  lisinopril (PRINIVIL,ZESTRIL) 5 MG tablet Take 5 mg by mouth daily.   Yes [provider]  naproxen sodium (ALEVE) 220 MG tablet Take 440 mg by mouth 2 (two) times daily as needed (pain).   Yes [provider]  nystatin (MYCOSTATIN) 100000 UNIT/ML suspension Take 5 mLs by mouth 4 (four) times daily. 08/06/17  Yes [provider]  ranitidine (ZANTAC) 150 MG tablet Take 150 mg by mouth daily.   Yes [provider]  tiotropium (SPIRIVA) 18 MCG inhalation capsule Place 1 capsule into inhaler and inhale daily.   Yes [provider]      PHYSICAL EXAMINATION:   VITAL SIGNS: Blood pressure 139/84, pulse (!) 44, resp. rate 20, height 5\' 3"  (1.6 m), weight 54.4 kg (120 lb), SpO2 (!) 79 %.  GENERAL:  77 y.o.-year-old patient lying in the bed with no acute distress.  EYES: Pupils equal, round, reactive to light and accommodation. No scleral icterus. Extraocular muscles intact.  HEENT: Head atraumatic, normocephalic. Oropharynx and nasopharynx clear.  NECK:  Supple, no jugular venous distention. No thyroid enlargement, no tenderness.  LUNGS: Normal breath sounds bilaterally, no wheezing, rales,rhonchi or crepitation. No use of accessory muscles of respiration.  CARDIOVASCULAR: S1, S2 normal. No murmurs, rubs, or gallops.  ABDOMEN: Soft, nontender, nondistended. Bowel sounds present. No organomegaly or mass.  EXTREMITIES: No pedal edema, cyanosis,  or clubbing.  NEUROLOGIC: Cranial nerves II through XII are intact. Muscle strength 5/5 in all extremities. Sensation intact. Gait not checked.  PSYCHIATRIC: The patient is alert and oriented x 3.  SKIN: No obvious rash, lesion, or ulcer.   LABORATORY PANEL:   CBC Recent Labs  Lab 08/14/17 1049  WBC 16.3*  HGB 14.3  HCT 41.8  PLT PLATELET CLUMPS NOTED ON SMEAR, COUNT APPEARS ADEQUATE  MCV 97.7  MCH 33.4  MCHC 34.2  RDW 16.2*   ------------------------------------------------------------------------------------------------------------------  Chemistries  Recent Labs  Lab 08/14/17 1049  NA 138  K 4.1  CL 96*  CO2 32  GLUCOSE 102*  BUN 23  CREATININE 1.08*  CALCIUM 9.1   ------------------------------------------------------------------------------------------------------------------ estimated creatinine clearance is 34.9 mL/min (A) (by C-G formula based on SCr of 1.08 mg/dL (H)). ------------------------------------------------------------------------------------------------------------------ No results for input(s): TSH, T4TOTAL, T3FREE, THYROIDAB in the last 72 hours.  Invalid input(s): FREET3   Coagulation profile No results for input(s): INR, PROTIME in the  last 168 hours. ------------------------------------------------------------------------------------------------------------------- No results for input(s): DDIMER in the last 72 hours. -------------------------------------------------------------------------------------------------------------------  Cardiac Enzymes Recent Labs  Lab 08/14/17 1049  TROPONINI <0.03   ------------------------------------------------------------------------------------------------------------------ Invalid input(s): POCBNP  ---------------------------------------------------------------------------------------------------------------  Urinalysis No results found for: COLORURINE, APPEARANCEUR, LABSPEC, PHURINE, GLUCOSEU,  HGBUR, BILIRUBINUR, KETONESUR, PROTEINUR, UROBILINOGEN, NITRITE, LEUKOCYTESUR   RADIOLOGY: Dg Chest 2 View  Result Date: 08/14/2017 CLINICAL DATA:  Two week history of shortness of breath and sharp left-sided chest discomfort. History of asthma-COPD. Current smoker. EXAM: CHEST - 2 VIEW COMPARISON:  PA and lateral chest x-ray of April 05, 2017 FINDINGS: The lungs are well-expanded. The interstitial markings are coarse though stable. There is no alveolar infiltrate or pleural effusion. There is no pneumothorax or pneumomediastinum. The heart and pulmonary vascularity are normal. There is calcification in the wall of the thoracic aorta. There is a small pleural effusion likely on the left. IMPRESSION: COPD.  New small left pleural effusion.  No CHF or pneumonia. Thoracic aortic atherosclerosis. Electronically Signed   By: David  [place] M.D.   On: 08/14/2017 11:05    EKG: Orders placed or performed during the hospital encounter of 08/14/17  . ED EKG within 10 minutes  . ED EKG within 10 minutes  . EKG 12-Lead  . 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 Management plans discussed with the patient, family and they are in agreement.  CODE STATUS:Full code    TOTAL TIME TAKING CARE OF THIS PATIENT: 53 minutes.    Ihor Austin M.D on 08/14/2017 at 3:36 PM  Between 7am to 6pm - Pager - [phone]  After 6pm  go to [email or web address] - password [email or web address] [facility]  [facility]  Office  [phone]  CC: Primary care physician; Hillery Aldo, MD

## 2017-08-14 NOTE — Progress Notes (Signed)
ANTICOAGULATION CONSULT NOTE - Initial Consult  Pharmacy Consult for Lovenox Indication: chest pain/ACS  Allergies  Allergen Reactions  . Gabapentin Hives  . Naproxen Hives  . Penicillins Hives    Has patient had a PCN reaction causing immediate rash, facial/tongue/throat swelling, SOB or lightheadedness with hypotension: No Has patient had a PCN reaction causing severe rash involving mucus membranes or skin necrosis: No Has patient had a PCN reaction that required hospitalization: No Has patient had a PCN reaction occurring within the last 10 years: No If all of the above answers are "NO", then may proceed with Cephalosporin use.  . Codeine Rash    Patient Measurements: Height: 5\' 3"  (160 cm) Weight: 120 lb (54.4 kg) IBW/kg (Calculated) : 52.4 Heparin Dosing Weight:    Vital Signs: BP: 148/85 (07/23 1530) Pulse Rate: 87 (07/23 1530)  Labs: Recent Labs    08/14/17 1049  HGB 14.3  HCT 41.8  PLT PLATELET CLUMPS NOTED ON SMEAR, COUNT APPEARS ADEQUATE  CREATININE 1.08*  TROPONINI <0.03    Estimated Creatinine Clearance: 34.9 mL/min (A) (by C-G formula based on SCr of 1.08 mg/dL (H)).   Medical History: Past Medical History:  Diagnosis Date  . Arthritis   . Asthma   . COPD (chronic obstructive pulmonary disease) (HCC)   . Hyperlipidemia   . Hypertension    Assessment: Patient is a 77yo female admitted for unstable angina. Pharmacy consulted for Lovenox dosing.  Goal of Therapy:  Monitor platelets by anticoagulation protocol: Yes   Plan:  Lovenox 1mg /kg (55mg ) SQ q12h. Will keep a close eye on renal function, may need to decrease dosing interval if CrCl declines further.  Clovia CuffLisa Nzinga Ferran, PharmD, BCPS 08/14/2017 4:03 PM

## 2017-08-14 NOTE — Progress Notes (Signed)
Patient reports that she has been taking Keflex once daily rather than 3 times a day as she's only taken prior antibiotics once per day.

## 2017-08-14 NOTE — ED Triage Notes (Signed)
Difficulty breathing x 2 weeks.  Patient also c/o chest pain with ambulation x 1 week.  Seen by PCP who has referred patient to Cardiology for cardiac workup.  Appointment is set for September.

## 2017-08-14 NOTE — ED Notes (Signed)
Pt denies pain at this time. Pt eating and in NAD. Pt and family updated on assigned bed.

## 2017-08-14 NOTE — ED Provider Notes (Signed)
[facility] Emergency Department Provider Note   ____________________________________________   First MD Initiated Contact with Patient 08/14/17 1248     (approximate)  I have reviewed the triage vital signs and the nursing notes.   HISTORY  Chief Complaint Shortness of Breath    HPI Christine Mcclain is a 77 y.o. female who comes in complaining of shortness of breath with exertion and pain under the center of the chest radiating up into the left arm down the left arm and into the back with exertion.  She had been able to walk a good distance 2 weeks ago when this started but now is progressed and she can only walk about 10 feet or less before she is very short of breath and has to sit down to rest for several minutes.  The chest pain also comes on immediately with the shortness of breath after she is exerted herself by walking for a few feet.  Patient is not having any nausea or sweating that I can elicit.  She has a history of COPD.  She has an occasional dry cough.  She is not running a fever.   Past Medical History:  Diagnosis Date  . Arthritis   . Asthma   . COPD (chronic obstructive pulmonary disease) (HCC)   . Hyperlipidemia   . Hypertension     Patient Active Problem List   Diagnosis Date Noted  . Personal history of tobacco use, presenting hazards to health 01/15/2016    Past Surgical History:  Procedure Laterality Date  . ABDOMINAL HYSTERECTOMY    . APPENDECTOMY    . BACK SURGERY    . CHOLECYSTECTOMY    . NECK SURGERY      Prior to Admission medications   Medication Sig Start Date End Date Taking? Authorizing Provider  alendronate (FOSAMAX) 70 MG tablet Take 70 mg by mouth once a week.    [provider]  aspirin 81 MG chewable tablet Chew 81 mg by mouth daily.    [provider]  atenolol (TENORMIN) 50 MG tablet Take 50 mg by mouth daily.    [provider]  Fluticasone-Salmeterol (ADVAIR DISKUS) 250-50  MCG/DOSE AEPB Inhale 1 puff into the lungs 2 (two) times daily.    [provider]  furosemide (LASIX) 40 MG tablet Take 40 mg by mouth daily.    [provider]  methylPREDNISolone (MEDROL DOSEPAK) 4 MG TBPK tablet Take Tapered dose as directed 04/05/17   Joni Reining, PA-C  POTASSIUM CHLORIDE ER PO Take 1 tablet by mouth daily.    [provider]  ranitidine (ZANTAC) 150 MG tablet Take 150 mg by mouth daily.    [provider]  rOPINIRole (REQUIP) 0.25 MG tablet Take 0.25 mg by mouth 2 (two) times daily.    [provider]  simvastatin (ZOCOR) 20 MG tablet Take 40 mg by mouth daily.    [provider]  tiotropium (SPIRIVA) 18 MCG inhalation capsule Place 1 capsule into inhaler and inhale daily.    [provider]    Allergies Gabapentin; Naproxen; Penicillins; and Codeine  Family History  Problem Relation Age of Onset  . Diabetes Mother   . Hyperlipidemia Father   . Hypertension Father   . Heart disease Father   . Emphysema Sister     Social History Social History   Tobacco Use  . Smoking status: Current Every Day Smoker    Packs/day: 1.50    Years: 62.00  Pack years: 93.00    Types: Cigarettes  . Smokeless tobacco: Never Used  Substance Use Topics  . Alcohol use: No  . Drug use: No    Review of Systems  Constitutional: No fever/chills Eyes: No visual changes. ENT: No sore throat. Cardiovascular:  chest pain. Respiratory:  shortness of breath. Gastrointestinal: No abdominal pain.  No nausea, no vomiting.  No diarrhea.  No constipation. Genitourinary: Negative for dysuria. Musculoskeletal: Negative for back pain. Skin: Negative for rash. Neurological: Negative for headaches, focal weakness  ____________________________________________   PHYSICAL EXAM:  VITAL SIGNS: ED Triage Vitals [08/14/17 1040]  Enc Vitals Group     BP      Pulse      Resp      Temp      Temp src      SpO2      Weight  120 lb (54.4 kg)     Height 5\' 3"  (1.6 m)     Head Circumference      Peak Flow      Pain Score 0     Pain Loc      Pain Edu?      Excl. in GC?     Constitutional: Alert and oriented. Well appearing and in no acute distress. Eyes: Conjunctivae are normal.  Head: Atraumatic. Nose: No congestion/rhinnorhea. Mouth/Throat: Mucous membranes are moist.  Oropharynx non-erythematous. Neck: No stridor. Cardiovascular: Normal rate, regular rhythm. Grossly normal heart sounds.  Good peripheral circulation. Respiratory: Normal respiratory effort.  No retractions. Lungs CTAB. Gastrointestinal: Soft and nontender. No distention. No abdominal bruits. No CVA tenderness. Musculoskeletal: No lower extremity tenderness nor edema.  No joint effusions. Neurologic:  Normal speech and language. No gross focal neurologic deficits are appreciated. No gait instability. Skin:  Skin is warm, dry and intact. No rash noted. Psychiatric: Mood and affect are normal. Speech and behavior are normal.  ____________________________________________   LABS (all labs ordered are listed, but only abnormal results are displayed)  Labs Reviewed  BASIC METABOLIC PANEL - Abnormal; Notable for the following components:      Result Value   Chloride 96 (*)    Glucose, Bld 102 (*)    Creatinine, Ser 1.08 (*)    GFR calc non Af Amer 48 (*)    GFR calc Af Amer 55 (*)    All other components within normal limits  CBC - Abnormal; Notable for the following components:   WBC 16.3 (*)    RDW 16.2 (*)    All other components within normal limits  TROPONIN I   ____________________________________________  EKG  EKG read interpreted by me shows normal sinus rhythm rate of 77 left axis left bundle branch block 1 PVC EKG is very similar to one from April of this year. ____________________________________________  RADIOLOGY  ED MD interpretation: X-ray read by radiology reviewed by me shows a new small left pleural effusion  no CHF or pneumonia per radiology I agree.  Official radiology report(s): Dg Chest 2 View  Result Date: 08/14/2017 CLINICAL DATA:  Two week history of shortness of breath and sharp left-sided chest discomfort. History of asthma-COPD. Current smoker. EXAM: CHEST - 2 VIEW COMPARISON:  PA and lateral chest x-ray of April 05, 2017 FINDINGS: The lungs are well-expanded. The interstitial markings are coarse though stable. There is no alveolar infiltrate or pleural effusion. There is no pneumothorax or pneumomediastinum. The heart and pulmonary vascularity are normal. There is calcification in the wall of the thoracic aorta. There is  a small pleural effusion likely on the left. IMPRESSION: COPD.  New small left pleural effusion.  No CHF or pneumonia. Thoracic aortic atherosclerosis. Electronically Signed   By: David  [place] M.D.   On: 08/14/2017 11:05    ____________________________________________   PROCEDURES  Procedure(s) performed:   Procedures  Critical Care performed:   ____________________________________________   INITIAL IMPRESSION / ASSESSMENT AND PLAN / ED COURSE  Patient's history is very concerning to me for crescendo angina.  When I mention this patient says she also called Dr. Gwen Pounds told him the same thing was told to come to the hospital.  We will plan on admitting this lady.      ____________________________________________   FINAL CLINICAL IMPRESSION(S) / ED DIAGNOSES  Final diagnoses:  Unstable angina [facility])     ED Discharge Orders    None       Note:  This document was prepared using Dragon voice recognition software and may include unintentional dictation errors.    Arnaldo Natal, MD 08/14/17 1320

## 2017-08-14 NOTE — Progress Notes (Signed)
Advanced care plan. Purpose of the Encounter: CODE STATUS Parties in Attendance:Patient Patient's Decision Capacity: Good Subjective/Patient's story: Presented for chest pain Objective/Medical story Has unstable angina and needs cardiac work up Goals of care determination:  Advance care directives and goals of care discussed For now patient wants everything done which includes cardiac resuscitation and intubation/ ventilator if need arises CODE STATUS: Full code Time spent discussing advanced care planning: 16 minutes

## 2017-08-14 NOTE — ED Notes (Signed)
Pt requesting food and Admitting MD verbalized pt could have a meal tray.

## 2017-08-14 NOTE — ED Notes (Signed)
Pt reports feeling less SOB after breathing treatment. Decreased wheezing noted. Pt is able to sit back in bed. Pt appears more comfortable. Family remains at bedside. Call bell in reach and pt in NAD.

## 2017-08-14 NOTE — Progress Notes (Signed)
Chaplain was rounding on ED and stop by patient room and introduced herself as Chaplain and ask was she feeling better since her arrival and patient said yes no more pain. Chaplain said that was great and all that matters. Patient said yes and I am glad. Chaplain said was there anything she could do for her. Patient said no I believe my husband did it all before he left. Chaplain said ok and she pray she continues to feel better. Patient thanked Christine Mcclain and Chaplain said your welcomed and left.

## 2017-08-15 LAB — CBC
HCT: 37.5 % (ref 35.0–47.0)
Hemoglobin: 12.9 g/dL (ref 12.0–16.0)
MCH: 33.8 pg (ref 26.0–34.0)
MCHC: 34.3 g/dL (ref 32.0–36.0)
MCV: 98.5 fL (ref 80.0–100.0)
PLATELETS: ADEQUATE 10*3/uL (ref 150–440)
RBC: 3.81 MIL/uL (ref 3.80–5.20)
RDW: 16.1 % — AB (ref 11.5–14.5)
WBC: 11.4 10*3/uL — AB (ref 3.6–11.0)

## 2017-08-15 LAB — LIPID PANEL
CHOL/HDL RATIO: 2.9 ratio
Cholesterol: 92 mg/dL (ref 0–200)
HDL: 32 mg/dL — ABNORMAL LOW (ref >40)
LDL Cholesterol: 52 mg/dL (ref 0–99)
Triglycerides: 42 mg/dL (ref <150)
VLDL: 8 mg/dL (ref 0–40)

## 2017-08-15 LAB — BASIC METABOLIC PANEL
Anion gap: 6 (ref 5–15)
BUN: 29 mg/dL — AB (ref 8–23)
CHLORIDE: 98 mmol/L (ref 98–111)
CO2: 34 mmol/L — ABNORMAL HIGH (ref 22–32)
Calcium: 9.2 mg/dL (ref 8.9–10.3)
Creatinine, Ser: 1.18 mg/dL — ABNORMAL HIGH (ref 0.44–1.00)
GFR calc Af Amer: 49 mL/min — ABNORMAL LOW (ref >60)
GFR calc non Af Amer: 43 mL/min — ABNORMAL LOW (ref >60)
GLUCOSE: 151 mg/dL — AB (ref 70–99)
Potassium: 3.9 mmol/L (ref 3.5–5.1)
Sodium: 138 mmol/L (ref 135–145)

## 2017-08-15 LAB — PROTIME-INR
INR: 1.31
PROTHROMBIN TIME: 16.2 s — AB (ref 11.4–15.2)

## 2017-08-15 LAB — TROPONIN I: Troponin I: <0.03 ng/mL (ref <0.03)

## 2017-08-15 MED ORDER — ENSURE ENLIVE PO LIQD
237.0000 mL | Freq: Two times a day (BID) | ORAL | Status: DC
  Administered 2017-08-15: 237 mL via ORAL

## 2017-08-15 NOTE — Progress Notes (Signed)
Patient is discharge in a stable condition via wheel chair , summary and f/u  Care given , verbalized understanding .

## 2017-08-15 NOTE — Progress Notes (Signed)
Initial Nutrition Assessment  DOCUMENTATION CODES:   Severe malnutrition in context of chronic illness  INTERVENTION:   - Ensure Enlive po BID, each supplement provides 350 kcal and 20 grams of protein (strawberry flavor)  - Encourage adequate PO intake  NUTRITION DIAGNOSIS:   Severe Malnutrition related to chronic illness, decreased appetite, early satiety(COPD) as evidenced by moderate fat depletion, severe fat depletion, mild muscle depletion, moderate muscle depletion, severe muscle depletion.  GOAL:   Patient will meet greater than or equal to 90% of their needs  MONITOR:   PO intake, Supplement acceptance, Weight trends, Labs, I & O's  REASON FOR ASSESSMENT:   Malnutrition Screening Tool, Consult Assessment of nutrition requirement/status, Poor PO  ASSESSMENT:   77 year old female who presented to the ED with SOB. PMH significant for COPD, hyperlipidemia, hypertension, ans asthma.  Discussed pt with RN.  Spoke with pt and husband at bedside. Pt states that lately (per pt, past 6 months) she has had a poor appetite and has not been eating well. Pt endorses weight loss.  Pt reports that she typically eats "probably 1 full meal throughout the day." A typical breakfast might include a sausage biscuit with tomato. Pt skips lunch. A typical dinner is "small" and might include 1 bite of creamed potatoes and a small tossed salad. Pt states that her PO intake has been less than usual over the past 6 months due to early satiety. Prior to this timeframe, pt reports having a "normal appetite."  Pt states that she did purchase some Ensure PTA and plans to consume these supplements after discharge. Pt agreeable to receiving Ensure Enlive (strawberry) during admission.  Pt shares that her UBW is 190 lbs and that she last weighed this 1 year ago. Weight history in chart appears to be stated rather than measured, so unsure of accuracy and unable to confirm weight loss at this  time.  Medications reviewed and include: 2,000 units vitamin D daily, 10 mg Pepcid daily, 20 mg Lasix daily  Labs reviewed: CO2 34 (H), BUN 29 (H), creatinine 1.18 (H), HDL 32 (L)  NUTRITION - FOCUSED PHYSICAL EXAM:    Most Recent Value  Orbital Region  Moderate depletion  Upper Arm Region  Severe depletion  Thoracic and Lumbar Region  Severe depletion  Buccal Region  Moderate depletion  Temple Region  Moderate depletion  Clavicle Bone Region  Severe depletion  Clavicle and Acromion Bone Region  Severe depletion  Scapular Bone Region  Moderate depletion  Dorsal Hand  Mild depletion  Patellar Region  Moderate depletion  Anterior Thigh Region  Severe depletion  Posterior Calf Region  Severe depletion  Edema (RD Assessment)  Mild [BLE]  Hair  Reviewed  Eyes  Reviewed  Mouth  Reviewed  Skin  Reviewed  Nails  Reviewed       Diet Order:   Diet Order           Diet Heart Room service appropriate? Yes; Fluid consistency: Thin  Diet effective now          EDUCATION NEEDS:   No education needs have been identified at this time  Skin:  Skin Assessment: Reviewed RN Assessment  Last BM:  08/15/17  Height:   Ht Readings from Last 1 Encounters:  08/14/17 5\' 3"  (1.6 m)    Weight:   Wt Readings from Last 1 Encounters:  08/14/17 113 lb 4.8 oz (51.4 kg)    Ideal Body Weight:  52.27 kg  BMI:  Body mass index  is 20.07 kg/m.  Estimated Nutritional Needs:   Kcal:  1500-1700 kcal/day  Protein:  70-85 grams/day  Fluid:  1.5-1.7 L/day    Earma ReadingKate Jablonski Amiayah Giebel, MS, RD, LDN Pager: [phone] Weekend/After Hours: [phone]

## 2017-08-15 NOTE — Care Management Obs Status (Signed)
MEDICARE OBSERVATION STATUS NOTIFICATION   Patient Details  Name: Christine Mcclain MRN: [id number] Date of Birth: 10/04/1940   Medicare Observation Status Notification Given:  No  Discharge order placed in < 24hr of being placed on observation   Eber HongGreene, Jaramie Bastos R, RN 08/15/2017, 11:10 AM

## 2017-08-15 NOTE — Discharge Summary (Signed)
[facility] - [place] [facility]   PATIENT NAME: Christine Mcclain    MR#:  [id number]  DATE OF BIRTH:  1941-01-23  DATE OF ADMISSION:  08/14/2017 ADMITTING PHYSICIAN: Ihor Austin, MD  DATE OF DISCHARGE: 08/15/2017 11:45 AM  PRIMARY CARE PHYSICIAN: Hillery Aldo, MD    ADMISSION DIAGNOSIS:  Unstable angina (HCC) [I20.0]  DISCHARGE DIAGNOSIS:  Active Problems:   Unstable angina (HCC)   SECONDARY DIAGNOSIS:   Past Medical History:  Diagnosis Date  . Arthritis   . Asthma   . COPD (chronic obstructive pulmonary disease) (HCC)   . Hyperlipidemia   . Hypertension     HOSPITAL COURSE:   1.  Chest pain unspecified.  Patient stated that Dr. Juliann Pares saw her and did not want to do any further testing.  Troponins were negative x4.  Patient had no further symptoms and wanted to go home. 2.  COPD.  Continue her usual inhalers and nebulizers.  This is not an exacerbation. 3.  Small left pleural effusion.  Looking back at old laboratory data the patient had a CT scan of the chest in 2017 and they recommended yearly screening for area in the anterior mediastinum.  This can be done as outpatient as patient wants to go home today. 4.  Platelet clumping.  In order to get an accurate platelet count.  This will likely have to be done in a special tube.  Patient wanted to go home so I did not stick the patient again. 5.  Essential hypertension on atenolol 6.  Tobacco abuse.  DISCHARGE CONDITIONS:   Satisfactory  CONSULTS OBTAINED:  Treatment Team:  Alwyn Pea, MD  DRUG ALLERGIES:   Allergies  Allergen Reactions  . Gabapentin Hives  . Naproxen Hives  . Penicillins Hives    Has patient had a PCN reaction causing immediate rash, facial/tongue/throat swelling, SOB or lightheadedness with hypotension: No Has patient had a PCN reaction causing severe rash involving mucus membranes or skin necrosis: No Has patient had a PCN reaction that required hospitalization:  No Has patient had a PCN reaction occurring within the last 10 years: No If all of the above answers are "NO", then may proceed with Cephalosporin use.  . Codeine Rash    DISCHARGE MEDICATIONS:   Allergies as of 08/15/2017      Reactions   Gabapentin Hives   Naproxen Hives   Penicillins Hives   Has patient had a PCN reaction causing immediate rash, facial/tongue/throat swelling, SOB or lightheadedness with hypotension: No Has patient had a PCN reaction causing severe rash involving mucus membranes or skin necrosis: No Has patient had a PCN reaction that required hospitalization: No Has patient had a PCN reaction occurring within the last 10 years: No If all of the above answers are "NO", then may proceed with Cephalosporin use.   Codeine Rash      Medication List    STOP taking these medications   cephALEXin 500 MG capsule Commonly known as:  KEFLEX   levofloxacin 500 MG tablet Commonly known as:  LEVAQUIN     TAKE these medications   albuterol (2.5 MG/3ML) 0.083% nebulizer solution Commonly known as:  PROVENTIL Take 3 mLs by nebulization every 4 (four) hours as needed for wheezing or shortness of breath.   alendronate 70 MG tablet Commonly known as:  FOSAMAX Take 70 mg by mouth every Saturday.   ammonium lactate 12 % cream Commonly known as:  AMLACTIN Apply topically 2 (two) times daily as needed  for dry skin.   aspirin EC 81 MG tablet Take 81 mg by mouth daily.   atenolol 50 MG tablet Commonly known as:  TENORMIN Take 50 mg by mouth daily.   atorvastatin 80 MG tablet Commonly known as:  LIPITOR Take 80 mg by mouth at bedtime.   budesonide-formoterol 160-4.5 MCG/ACT inhaler Commonly known as:  SYMBICORT Inhale 2 puffs into the lungs 2 (two) times daily.   cholecalciferol 1000 units tablet Commonly known as:  VITAMIN D Take 2,000 Units by mouth daily.   fluticasone 50 MCG/ACT nasal spray Commonly known as:  FLONASE Place 2 sprays into both nostrils  daily.   furosemide 20 MG tablet Commonly known as:  LASIX Take 20 mg by mouth daily.   lisinopril 5 MG tablet Commonly known as:  PRINIVIL,ZESTRIL Take 5 mg by mouth daily.   naproxen sodium 220 MG tablet Commonly known as:  ALEVE Take 440 mg by mouth 2 (two) times daily as needed (pain).   nystatin 100000 UNIT/ML suspension Commonly known as:  MYCOSTATIN Take 5 mLs by mouth 4 (four) times daily.   ranitidine 150 MG tablet Commonly known as:  ZANTAC Take 150 mg by mouth daily.   tiotropium 18 MCG inhalation capsule Commonly known as:  SPIRIVA Place 1 capsule into inhaler and inhale daily.        DISCHARGE INSTRUCTIONS:   Follow-up with PMD 6 days  If you experience worsening of your admission symptoms, develop shortness of breath, life threatening emergency, suicidal or homicidal thoughts you must seek medical attention immediately by calling 911 or calling your MD immediately  if symptoms less severe.  You Must read complete instructions/literature along with all the possible adverse reactions/side effects for all the Medicines you take and that have been prescribed to you. Take any new Medicines after you have completely understood and accept all the possible adverse reactions/side effects.   Please note  You were cared for by a hospitalist during your hospital stay. If you have any questions about your discharge medications or the care you received while you were in the hospital after you are discharged, you can call the unit and asked to speak with the hospitalist on call if the hospitalist that took care of you is not available. Once you are discharged, your primary care physician will handle any further medical issues. Please note that NO REFILLS for any discharge medications will be authorized once you are discharged, as it is imperative that you return to your primary care physician (or establish a relationship with a primary care physician if you do not have one) for  your aftercare needs so that they can reassess your need for medications and monitor your lab values.    Today   CHIEF COMPLAINT:   Chief Complaint  Patient presents with  . Shortness of Breath    HISTORY OF PRESENT ILLNESS:  Christine Mcclain  is a 77 y.o. female presented with shortness of breath and chest pain   VITAL SIGNS:  Blood pressure (!) 124/54, pulse 78, temperature 98.3 F (36.8 C), temperature source Oral, resp. rate 14, height 5\' 3"  (1.6 m), weight 51.4 kg (113 lb 4.8 oz), SpO2 91 %.   PHYSICAL EXAMINATION:  GENERAL:  77 y.o.-year-old patient lying in the bed with no acute distress.  EYES: Pupils equal, round, reactive to light and accommodation. No scleral icterus. Extraocular muscles intact.  HEENT: Head atraumatic, normocephalic. Oropharynx and nasopharynx clear.  NECK:  Supple, no jugular venous distention. No thyroid  enlargement, no tenderness.  LUNGS:  decreased breath sounds bilateral  bases, no wheezing, rales,rhonchi or crepitation. No use of accessory muscles of respiration.  CARDIOVASCULAR: S1, S2 normal. No murmurs, rubs, or gallops.  ABDOMEN: Soft, non-tender, non-distended. Bowel sounds present. No organomegaly or mass.  EXTREMITIES: No pedal edema, cyanosis, or clubbing.  NEUROLOGIC: Cranial nerves II through XII are intact. Muscle strength 5/5 in all extremities. Sensation intact. Gait not checked.  PSYCHIATRIC: The patient is alert and oriented x 3.  SKIN: No obvious rash, lesion, or ulcer.   DATA REVIEW:   CBC Recent Labs  Lab 08/15/17 0450  WBC 11.4*  HGB 12.9  HCT 37.5  PLT PLATELET CLUMPS NOTED ON SMEAR, COUNT APPEARS ADEQUATE    Chemistries  Recent Labs  Lab 08/15/17 0450  NA 138  K 3.9  CL 98  CO2 34*  GLUCOSE 151*  BUN 29*  CREATININE 1.18*  CALCIUM 9.2    Cardiac Enzymes Recent Labs  Lab 08/15/17 0450  TROPONINI <0.03      RADIOLOGY:  Dg Chest 2 View  Result Date: 08/14/2017 CLINICAL DATA:  Two week history of  shortness of breath and sharp left-sided chest discomfort. History of asthma-COPD. Current smoker. EXAM: CHEST - 2 VIEW COMPARISON:  PA and lateral chest x-ray of April 05, 2017 FINDINGS: The lungs are well-expanded. The interstitial markings are coarse though stable. There is no alveolar infiltrate or pleural effusion. There is no pneumothorax or pneumomediastinum. The heart and pulmonary vascularity are normal. There is calcification in the wall of the thoracic aorta. There is a small pleural effusion likely on the left. IMPRESSION: COPD.  New small left pleural effusion.  No CHF or pneumonia. Thoracic aortic atherosclerosis. Electronically Signed   By: David  [place] M.D.   On: 08/14/2017 11:05      Management plans discussed with the patient, family and they are in agreement.  CODE STATUS:  Code Status History    Date Active Date Inactive Code Status Order ID Comments User Context   08/14/2017 1649 08/15/2017 1549 Full Code [id number]  Ihor Austin, MD Inpatient      TOTAL TIME TAKING CARE OF THIS PATIENT: 35 minutes.    Alford Highland M.D on 08/15/2017 at 4:21 PM  Between 7am to 6pm - Pager - [phone]  After 6pm go to [email or web address] - password [email or web address]  [facility] Office  [phone]  CC: Primary care physician; Hillery Aldo, MD

## 2017-08-15 NOTE — Plan of Care (Signed)
  Problem: Activity: Goal: Ability to tolerate increased activity will improve Outcome: Progressing Goal: Will verbalize the importance of balancing activity with adequate rest periods Outcome: Progressing   Problem: Education: Goal: Knowledge of General Education information will improve Description Including pain rating scale, medication(s)/side effects and non-pharmacologic comfort measures Outcome: Progressing   Problem: Health Behavior/Discharge Planning: Goal: Ability to manage health-related needs will improve Outcome: Progressing   Problem: Clinical Measurements: Goal: Ability to maintain clinical measurements within normal limits will improve Outcome: Progressing Goal: Will remain free from infection Outcome: Progressing Goal: Diagnostic test results will improve Outcome: Progressing Goal: Respiratory complications will improve Outcome: Progressing Goal: Cardiovascular complication will be avoided Outcome: Progressing   Problem: Activity: Goal: Risk for activity intolerance will decrease Outcome: Progressing   Problem: Pain Managment: Goal: General experience of comfort will improve Outcome: Progressing   Problem: Safety: Goal: Ability to remain free from injury will improve Outcome: Progressing   Problem: Skin Integrity: Goal: Risk for impaired skin integrity will decrease Outcome: Progressing

## 2017-08-15 NOTE — Progress Notes (Signed)
Patient has no acute event overnight, she remained in NSR, VSS denied being in any discomfort. Patient kept NPO per order.

## 2017-08-16 NOTE — Consult Note (Signed)
Reason for Consult: Shortness of breath COPD Referring Physician: Denton Lank primary hospitalist Dr. Peter Congo Christine Mcclain is an 77 y.o. female.  HPI: Patient is a 77 year old white female history of smoking COPD asthma emphysema arthritis hyperlipidemia hypertension presented emergency room with chest pain symptoms.  EKG was unremarkable patient been having chest pain on and off for the last 2 weeks.  Complained of this on the left side of her chest that radiates to the left arm and back initial troponins were negative patient was on antibiotics for bladder infection patient is followed by [facility] cardiology.  Patient feels somewhat better still short of breath is eager to be discharged to go home denies any leg swelling  Past Medical History:  Diagnosis Date  . Arthritis   . Asthma   . COPD (chronic obstructive pulmonary disease) ([place])   . Hyperlipidemia   . Hypertension     Past Surgical History:  Procedure Laterality Date  . ABDOMINAL HYSTERECTOMY    . APPENDECTOMY    . BACK SURGERY    . CHOLECYSTECTOMY    . NECK SURGERY      Family History  Problem Relation Age of Onset  . Diabetes Mother   . Hyperlipidemia Father   . Hypertension Father   . Heart disease Father   . Emphysema Sister     Social History:  reports that she has been smoking cigarettes.  She has a 93.00 pack-year smoking history. She has never used smokeless tobacco. She reports that she does not drink alcohol or use drugs.  Allergies:  Allergies  Allergen Reactions  . Gabapentin Hives  . Naproxen Hives  . Penicillins Hives    Has patient had a PCN reaction causing immediate rash, facial/tongue/throat swelling, SOB or lightheadedness with hypotension: No Has patient had a PCN reaction causing severe rash involving mucus membranes or skin necrosis: No Has patient had a PCN reaction that required hospitalization: No Has patient had a PCN reaction occurring within the last 10 years: No If all of the above  answers are "NO", then may proceed with Cephalosporin use.  . Codeine Rash    Medications: I have reviewed the patient's current medications.  Results for orders placed or performed during the hospital encounter of 08/14/17 (from the past 48 hour(s))  Troponin I     Status: Abnormal   Collection Time: 08/14/17  5:19 PM  Result Value Ref Range   Troponin I 0.03 (HH) <0.03 ng/mL    Comment: CRITICAL RESULT CALLED TO, READ BACK BY AND VERIFIED WITH JANCEY JOHNSTONE 08/14/17 1757 KLW Performed at [facility], [address] [place], [place] [address]   Troponin I     Status: None   Collection Time: 08/14/17 10:53 PM  Result Value Ref Range   Troponin I <0.03 <0.03 ng/mL    Comment: Performed at [facility], [address] [place], [place] [address]  Troponin I     Status: None   Collection Time: 08/15/17  4:50 AM  Result Value Ref Range   Troponin I <0.03 <0.03 ng/mL    Comment: Performed at [facility], [address], [place], [place] [address]  Basic metabolic panel     Status: Abnormal   Collection Time: 08/15/17  4:50 AM  Result Value Ref Range   Sodium 138 135 - 145 mmol/L   Potassium 3.9 3.5 - 5.1 mmol/L   Chloride 98 98 - 111 mmol/L   CO2 34 (H) 22 - 32 mmol/L  Glucose, Bld 151 (H) 70 - 99 mg/dL   BUN 29 (H) 8 - 23 mg/dL   Creatinine, Ser 1.18 (H) 0.44 - 1.00 mg/dL   Calcium 9.2 8.9 - 10.3 mg/dL   GFR calc non Af Amer 43 (L) >60 mL/min   GFR calc Af Amer 49 (L) >60 mL/min    Comment: (NOTE) The eGFR has been calculated using the CKD EPI equation. This calculation has not been validated in all clinical situations. eGFR's persistently <60 mL/min signify possible Chronic Kidney Disease.    Anion gap 6 5 - 15    Comment: Performed at [facility], [address] [place], [place] [address]  Lipid panel     Status: Abnormal   Collection Time: 08/15/17  4:50 AM  Result Value Ref Range   Cholesterol 92 0 - 200 mg/dL    Triglycerides 42 <150 mg/dL   HDL 32 (L) >40 mg/dL   Total CHOL/HDL Ratio 2.9 RATIO   VLDL 8 0 - 40 mg/dL   LDL Cholesterol 52 0 - 99 mg/dL    Comment:        Total Cholesterol/HDL:CHD Risk Coronary Heart Disease Risk Table                     Men   Women  1/2 Average Risk   3.4   3.3  Average Risk       5.0   4.4  2 X Average Risk   9.6   7.1  3 X Average Risk  23.4   11.0        Use the calculated Patient Ratio above and the CHD Risk Table to determine the patient's CHD Risk.        ATP III CLASSIFICATION (LDL):  <100     mg/dL   Optimal  100-129  mg/dL   Near or Above                    Optimal  130-159  mg/dL   Borderline  160-189  mg/dL   High  >190     mg/dL   Very High Performed at [facility], [address] [place], [place] [address]   CBC     Status: Abnormal   Collection Time: 08/15/17  4:50 AM  Result Value Ref Range   WBC 11.4 (H) 3.6 - 11.0 K/uL   RBC 3.81 3.80 - 5.20 MIL/uL   Hemoglobin 12.9 12.0 - 16.0 g/dL   HCT 37.5 35.0 - 47.0 %   MCV 98.5 80.0 - 100.0 fL   MCH 33.8 26.0 - 34.0 pg   MCHC 34.3 32.0 - 36.0 g/dL   RDW 16.1 (H) 11.5 - 14.5 %   Platelets  150 - 440 K/uL    PLATELET CLUMPS NOTED ON SMEAR, COUNT APPEARS ADEQUATE    Comment: SPECIMEN COLLECTED IN [place] DID NOT CORRECT INTERFERENCE. QSD Performed at [facility], [address] [place], [place] [address]   Protime-INR     Status: Abnormal   Collection Time: 08/15/17  4:50 AM  Result Value Ref Range   Prothrombin Time 16.2 (H) 11.4 - 15.2 seconds   INR 1.31     Comment: Performed at [facility], [address], [place], [place] [address]    No results found.  Review of Systems  Constitutional: Positive for diaphoresis and malaise/fatigue.  HENT: Positive for congestion.   Eyes: Negative.   Respiratory: Positive for shortness of breath.  Cardiovascular: Positive for orthopnea and leg swelling.  Gastrointestinal: Negative.   Genitourinary:  Negative.   Musculoskeletal: Negative.   Skin: Negative.   Neurological: Negative.   Endo/Heme/Allergies: Negative.   Psychiatric/Behavioral: Negative.    Blood pressure (!) 124/54, pulse 78, temperature 98.3 F (36.8 C), temperature source Oral, resp. rate 14, height '5\' 3"'$  (1.6 m), weight 113 lb 4.8 oz (51.4 kg), SpO2 91 %. Physical Exam  Nursing note and vitals reviewed. Constitutional: She is oriented to person, place, and time. She appears well-developed and well-nourished.  HENT:  Head: Normocephalic and atraumatic.  Eyes: Pupils are equal, round, and reactive to light. Conjunctivae and EOM are normal.  Neck: Normal range of motion. Neck supple.  Cardiovascular: Normal rate and regular rhythm.  Murmur heard. Respiratory: Effort normal and breath sounds normal.  GI: Soft. Bowel sounds are normal.  Musculoskeletal: Normal range of motion.  Neurological: She is alert and oriented to person, place, and time. She has normal reflexes.  Skin: Skin is warm and dry.  Psychiatric: She has a normal mood and affect.    Assessment/Plan: Shortness of breath COPD Asthma Hypertension Hyperlipidemia DJD Emphysema Smoking . Plan Agree with admit for rule out for microinfarction Continue follow-up EKGs and troponins Recommend supplemental oxygen Agree with inhaler therapy Advise patient refrain from smoking Consider referral to pulmonary Broad-spectrum antibiotic therapy for possible bronchitis Consider steroid therapy for COPD emphysema  Rahshawn Remo D Daylon Lafavor 08/16/2017, 1:30 PM

## 2017-08-23 ENCOUNTER — Ambulatory Visit: Payer: Medicare Other

## 2017-09-05 ENCOUNTER — Ambulatory Visit
Admission: RE | Admit: 2017-09-05 | Discharge: 2017-09-05 | Disposition: A | Discharge status: Home / Self Care | Payer: Medicare Other | Source: Ambulatory Visit | Attending: Family Medicine | Admitting: Family Medicine

## 2017-09-05 DIAGNOSIS — I700 Atherosclerosis of aorta: Secondary | ICD-10-CM | POA: Diagnosis not present

## 2017-09-05 DIAGNOSIS — J432 Centrilobular emphysema: Secondary | ICD-10-CM | POA: Insufficient documentation

## 2017-09-05 DIAGNOSIS — R599 Enlarged lymph nodes, unspecified: Secondary | ICD-10-CM

## 2017-09-05 DIAGNOSIS — I517 Cardiomegaly: Secondary | ICD-10-CM | POA: Diagnosis not present

## 2017-09-05 MED ORDER — IOHEXOL 300 MG/ML  SOLN
75.0000 mL | Freq: Once | INTRAMUSCULAR | Status: AC | PRN
  Administered 2017-09-05: 75 mL via INTRAVENOUS

## 2017-09-21 ENCOUNTER — Other Ambulatory Visit: Payer: Self-pay | Admitting: *Deleted

## 2017-10-03 ENCOUNTER — Ambulatory Visit: Payer: Medicare Other | Admitting: Internal Medicine

## 2017-10-03 ENCOUNTER — Encounter

## 2017-10-22 ENCOUNTER — Other Ambulatory Visit: Payer: Self-pay | Admitting: Family Medicine

## 2017-10-22 DIAGNOSIS — R1310 Dysphagia, unspecified: Secondary | ICD-10-CM

## 2017-10-29 ENCOUNTER — Ambulatory Visit
Admission: RE | Admit: 2017-10-29 | Discharge: 2017-10-29 | Disposition: A | Discharge status: Home / Self Care | Payer: Medicare Other | Source: Ambulatory Visit | Attending: Family Medicine | Admitting: Family Medicine

## 2017-10-29 DIAGNOSIS — R1310 Dysphagia, unspecified: Secondary | ICD-10-CM | POA: Diagnosis present

## 2018-01-17 ENCOUNTER — Emergency Department: Payer: Medicare Other

## 2018-01-17 ENCOUNTER — Inpatient Hospital Stay
Admission: EM | Admit: 2018-01-17 | Discharge: 2018-02-23 | DRG: 393 | Disposition: E | Payer: Medicare Other | Attending: Internal Medicine | Admitting: Internal Medicine

## 2018-01-17 ENCOUNTER — Other Ambulatory Visit: Payer: Self-pay

## 2018-01-17 DIAGNOSIS — A09 Infectious gastroenteritis and colitis, unspecified: Secondary | ICD-10-CM | POA: Diagnosis present

## 2018-01-17 DIAGNOSIS — Z886 Allergy status to analgesic agent status: Secondary | ICD-10-CM

## 2018-01-17 DIAGNOSIS — I130 Hypertensive heart and chronic kidney disease with heart failure and stage 1 through stage 4 chronic kidney disease, or unspecified chronic kidney disease: Secondary | ICD-10-CM | POA: Diagnosis present

## 2018-01-17 DIAGNOSIS — N170 Acute kidney failure with tubular necrosis: Secondary | ICD-10-CM | POA: Diagnosis present

## 2018-01-17 DIAGNOSIS — Z0189 Encounter for other specified special examinations: Secondary | ICD-10-CM

## 2018-01-17 DIAGNOSIS — N183 Chronic kidney disease, stage 3 (moderate): Secondary | ICD-10-CM | POA: Diagnosis present

## 2018-01-17 DIAGNOSIS — R6521 Severe sepsis with septic shock: Secondary | ICD-10-CM | POA: Diagnosis not present

## 2018-01-17 DIAGNOSIS — I5023 Acute on chronic systolic (congestive) heart failure: Secondary | ICD-10-CM | POA: Diagnosis present

## 2018-01-17 DIAGNOSIS — J449 Chronic obstructive pulmonary disease, unspecified: Secondary | ICD-10-CM

## 2018-01-17 DIAGNOSIS — E875 Hyperkalemia: Secondary | ICD-10-CM | POA: Diagnosis present

## 2018-01-17 DIAGNOSIS — K529 Noninfective gastroenteritis and colitis, unspecified: Secondary | ICD-10-CM | POA: Diagnosis not present

## 2018-01-17 DIAGNOSIS — E440 Moderate protein-calorie malnutrition: Secondary | ICD-10-CM

## 2018-01-17 DIAGNOSIS — I4891 Unspecified atrial fibrillation: Secondary | ICD-10-CM | POA: Diagnosis present

## 2018-01-17 DIAGNOSIS — R1032 Left lower quadrant pain: Secondary | ICD-10-CM | POA: Diagnosis present

## 2018-01-17 DIAGNOSIS — K559 Vascular disorder of intestine, unspecified: Secondary | ICD-10-CM | POA: Diagnosis present

## 2018-01-17 DIAGNOSIS — Z9981 Dependence on supplemental oxygen: Secondary | ICD-10-CM | POA: Diagnosis not present

## 2018-01-17 DIAGNOSIS — E785 Hyperlipidemia, unspecified: Secondary | ICD-10-CM | POA: Diagnosis present

## 2018-01-17 DIAGNOSIS — E871 Hypo-osmolality and hyponatremia: Secondary | ICD-10-CM | POA: Diagnosis present

## 2018-01-17 DIAGNOSIS — Z66 Do not resuscitate: Secondary | ICD-10-CM | POA: Diagnosis present

## 2018-01-17 DIAGNOSIS — Z888 Allergy status to other drugs, medicaments and biological substances status: Secondary | ICD-10-CM

## 2018-01-17 DIAGNOSIS — J189 Pneumonia, unspecified organism: Secondary | ICD-10-CM | POA: Diagnosis present

## 2018-01-17 DIAGNOSIS — J441 Chronic obstructive pulmonary disease with (acute) exacerbation: Secondary | ICD-10-CM | POA: Diagnosis present

## 2018-01-17 DIAGNOSIS — Z8249 Family history of ischemic heart disease and other diseases of the circulatory system: Secondary | ICD-10-CM

## 2018-01-17 DIAGNOSIS — Z515 Encounter for palliative care: Secondary | ICD-10-CM | POA: Diagnosis not present

## 2018-01-17 DIAGNOSIS — J440 Chronic obstructive pulmonary disease with acute lower respiratory infection: Secondary | ICD-10-CM | POA: Diagnosis present

## 2018-01-17 DIAGNOSIS — Z7189 Other specified counseling: Secondary | ICD-10-CM

## 2018-01-17 DIAGNOSIS — Z7951 Long term (current) use of inhaled steroids: Secondary | ICD-10-CM

## 2018-01-17 DIAGNOSIS — Z8349 Family history of other endocrine, nutritional and metabolic diseases: Secondary | ICD-10-CM

## 2018-01-17 DIAGNOSIS — J9621 Acute and chronic respiratory failure with hypoxia: Secondary | ICD-10-CM | POA: Diagnosis not present

## 2018-01-17 DIAGNOSIS — I248 Other forms of acute ischemic heart disease: Secondary | ICD-10-CM | POA: Diagnosis present

## 2018-01-17 DIAGNOSIS — T508X5A Adverse effect of diagnostic agents, initial encounter: Secondary | ICD-10-CM | POA: Diagnosis present

## 2018-01-17 DIAGNOSIS — A419 Sepsis, unspecified organism: Secondary | ICD-10-CM | POA: Diagnosis not present

## 2018-01-17 DIAGNOSIS — F17210 Nicotine dependence, cigarettes, uncomplicated: Secondary | ICD-10-CM | POA: Diagnosis present

## 2018-01-17 DIAGNOSIS — Z880 Allergy status to penicillin: Secondary | ICD-10-CM

## 2018-01-17 DIAGNOSIS — E860 Dehydration: Secondary | ICD-10-CM | POA: Diagnosis present

## 2018-01-17 DIAGNOSIS — R319 Hematuria, unspecified: Secondary | ICD-10-CM | POA: Diagnosis present

## 2018-01-17 DIAGNOSIS — R0902 Hypoxemia: Secondary | ICD-10-CM

## 2018-01-17 DIAGNOSIS — R0602 Shortness of breath: Secondary | ICD-10-CM

## 2018-01-17 DIAGNOSIS — R570 Cardiogenic shock: Secondary | ICD-10-CM | POA: Diagnosis not present

## 2018-01-17 DIAGNOSIS — J9601 Acute respiratory failure with hypoxia: Secondary | ICD-10-CM | POA: Diagnosis not present

## 2018-01-17 DIAGNOSIS — Z885 Allergy status to narcotic agent status: Secondary | ICD-10-CM

## 2018-01-17 DIAGNOSIS — I5082 Biventricular heart failure: Secondary | ICD-10-CM | POA: Diagnosis not present

## 2018-01-17 DIAGNOSIS — Z6820 Body mass index (BMI) 20.0-20.9, adult: Secondary | ICD-10-CM

## 2018-01-17 DIAGNOSIS — Z7982 Long term (current) use of aspirin: Secondary | ICD-10-CM

## 2018-01-17 DIAGNOSIS — I272 Pulmonary hypertension, unspecified: Secondary | ICD-10-CM | POA: Diagnosis present

## 2018-01-17 DIAGNOSIS — E872 Acidosis: Secondary | ICD-10-CM | POA: Diagnosis present

## 2018-01-17 DIAGNOSIS — Z79899 Other long term (current) drug therapy: Secondary | ICD-10-CM

## 2018-01-17 DIAGNOSIS — R1310 Dysphagia, unspecified: Secondary | ICD-10-CM | POA: Diagnosis present

## 2018-01-17 DIAGNOSIS — M1990 Unspecified osteoarthritis, unspecified site: Secondary | ICD-10-CM | POA: Diagnosis present

## 2018-01-17 DIAGNOSIS — Z825 Family history of asthma and other chronic lower respiratory diseases: Secondary | ICD-10-CM

## 2018-01-17 DIAGNOSIS — I200 Unstable angina: Secondary | ICD-10-CM | POA: Diagnosis not present

## 2018-01-17 DIAGNOSIS — R109 Unspecified abdominal pain: Secondary | ICD-10-CM

## 2018-01-17 DIAGNOSIS — J181 Lobar pneumonia, unspecified organism: Secondary | ICD-10-CM

## 2018-01-17 DIAGNOSIS — R579 Shock, unspecified: Secondary | ICD-10-CM | POA: Diagnosis not present

## 2018-01-17 DIAGNOSIS — Z90710 Acquired absence of both cervix and uterus: Secondary | ICD-10-CM

## 2018-01-17 LAB — COMPREHENSIVE METABOLIC PANEL
ALT: 79 U/L — ABNORMAL HIGH (ref 0–44)
AST: 273 U/L — ABNORMAL HIGH (ref 15–41)
Albumin: 2.7 g/dL — ABNORMAL LOW (ref 3.5–5.0)
Alkaline Phosphatase: 51 U/L (ref 38–126)
Anion gap: 8 (ref 5–15)
BUN: 51 mg/dL — ABNORMAL HIGH (ref 8–23)
CO2: 29 mmol/L (ref 22–32)
Calcium: 8.6 mg/dL — ABNORMAL LOW (ref 8.9–10.3)
Chloride: 97 mmol/L — ABNORMAL LOW (ref 98–111)
Creatinine, Ser: 1.41 mg/dL — ABNORMAL HIGH (ref 0.44–1.00)
GFR calc Af Amer: 41 mL/min — ABNORMAL LOW (ref >60)
GFR, EST NON AFRICAN AMERICAN: 35 mL/min — AB (ref >60)
Glucose, Bld: 112 mg/dL — ABNORMAL HIGH (ref 70–99)
Potassium: 5.2 mmol/L — ABNORMAL HIGH (ref 3.5–5.1)
Sodium: 134 mmol/L — ABNORMAL LOW (ref 135–145)
TOTAL PROTEIN: 6 g/dL — AB (ref 6.5–8.1)
Total Bilirubin: 0.7 mg/dL (ref 0.3–1.2)

## 2018-01-17 LAB — CBC WITH DIFFERENTIAL/PLATELET
Abs Immature Granulocytes: 0.11 10*3/uL — ABNORMAL HIGH (ref 0.00–0.07)
Basophils Absolute: 0 10*3/uL (ref 0.0–0.1)
Basophils Relative: 0 %
Eosinophils Absolute: 0 10*3/uL (ref 0.0–0.5)
Eosinophils Relative: 0 %
HCT: 41.5 % (ref 36.0–46.0)
Hemoglobin: 13.8 g/dL (ref 12.0–15.0)
IMMATURE GRANULOCYTES: 1 %
Lymphocytes Relative: 8 %
Lymphs Abs: 1 10*3/uL (ref 0.7–4.0)
MCH: 33.3 pg (ref 26.0–34.0)
MCHC: 33.3 g/dL (ref 30.0–36.0)
MCV: 100 fL (ref 80.0–100.0)
MONOS PCT: 9 %
Monocytes Absolute: 1.1 10*3/uL — ABNORMAL HIGH (ref 0.1–1.0)
Neutro Abs: 9.8 10*3/uL — ABNORMAL HIGH (ref 1.7–7.7)
Neutrophils Relative %: 82 %
Platelets: UNDETERMINED 10*3/uL (ref 150–400)
RBC: 4.15 MIL/uL (ref 3.87–5.11)
RDW: 14.6 % (ref 11.5–15.5)
WBC: 12 10*3/uL — ABNORMAL HIGH (ref 4.0–10.5)
nRBC: 0 % (ref 0.0–0.2)

## 2018-01-17 LAB — URINALYSIS, COMPLETE (UACMP) WITH MICROSCOPIC
Bilirubin Urine: NEGATIVE
Glucose, UA: NEGATIVE mg/dL
Ketones, ur: NEGATIVE mg/dL
Nitrite: NEGATIVE
PROTEIN: 30 mg/dL — AB
Specific Gravity, Urine: 1.02 (ref 1.005–1.030)
WBC, UA: >50 WBC/hpf — ABNORMAL HIGH (ref 0–5)
pH: 7 (ref 5.0–8.0)

## 2018-01-17 LAB — TYPE AND SCREEN
ABO/RH(D): A POS
Antibody Screen: NEGATIVE

## 2018-01-17 LAB — BRAIN NATRIURETIC PEPTIDE: B Natriuretic Peptide: 87 pg/mL (ref 0.0–100.0)

## 2018-01-17 LAB — TROPONIN I: Troponin I: 0.1 ng/mL (ref <0.03)

## 2018-01-17 MED ORDER — SODIUM CHLORIDE 0.9 % IV SOLN
INTRAVENOUS | Status: DC
  Administered 2018-01-17 – 2018-01-22 (×8): via INTRAVENOUS

## 2018-01-17 MED ORDER — LEVOFLOXACIN IN D5W 750 MG/150ML IV SOLN: 750.0000 mg | Freq: Once | INTRAVENOUS | Status: DC

## 2018-01-17 MED ORDER — CIPROFLOXACIN IN D5W 400 MG/200ML IV SOLN: 400.0000 mg | Freq: Two times a day (BID) | INTRAVENOUS | Status: DC

## 2018-01-17 MED ORDER — TRAZODONE HCL 50 MG PO TABS
25.0000 mg | ORAL_TABLET | Freq: Every evening | ORAL | Status: DC | PRN
  Administered 2018-01-27: 25 mg via ORAL
  Filled 2018-01-17: qty 1

## 2018-01-17 MED ORDER — ONDANSETRON HCL 4 MG PO TABS: 4.0000 mg | ORAL_TABLET | Freq: Four times a day (QID) | ORAL | Status: DC | PRN

## 2018-01-17 MED ORDER — METRONIDAZOLE IN NACL 5-0.79 MG/ML-% IV SOLN
500.0000 mg | Freq: Three times a day (TID) | INTRAVENOUS | Status: AC
  Administered 2018-01-17 – 2018-01-18 (×5): 500 mg via INTRAVENOUS
  Filled 2018-01-17 (×5): qty 100

## 2018-01-17 MED ORDER — SODIUM CHLORIDE 0.9 % IV SOLN
Freq: Once | INTRAVENOUS | Status: AC
  Administered 2018-01-17: 12:00:00 via INTRAVENOUS

## 2018-01-17 MED ORDER — ONDANSETRON HCL 4 MG/2ML IJ SOLN
4.0000 mg | Freq: Once | INTRAMUSCULAR | Status: AC
  Administered 2018-01-17: 4 mg via INTRAVENOUS
  Filled 2018-01-17: qty 2

## 2018-01-17 MED ORDER — LEVOFLOXACIN IN D5W 750 MG/150ML IV SOLN
750.0000 mg | Freq: Once | INTRAVENOUS | Status: AC
  Administered 2018-01-17: 750 mg via INTRAVENOUS
  Filled 2018-01-17: qty 150

## 2018-01-17 MED ORDER — BISACODYL 5 MG PO TBEC
5.0000 mg | DELAYED_RELEASE_TABLET | Freq: Every day | ORAL | Status: DC | PRN
  Filled 2018-01-17: qty 1

## 2018-01-17 MED ORDER — IOHEXOL 300 MG/ML  SOLN
60.0000 mL | Freq: Once | INTRAMUSCULAR | Status: AC | PRN
  Administered 2018-01-17: 60 mL via INTRAVENOUS

## 2018-01-17 MED ORDER — PANTOPRAZOLE SODIUM 40 MG IV SOLR
40.0000 mg | Freq: Two times a day (BID) | INTRAVENOUS | Status: DC
  Administered 2018-01-17 – 2018-01-19 (×5): 40 mg via INTRAVENOUS
  Filled 2018-01-17 (×5): qty 40

## 2018-01-17 MED ORDER — ONDANSETRON HCL 4 MG/2ML IJ SOLN
4.0000 mg | Freq: Four times a day (QID) | INTRAMUSCULAR | Status: DC | PRN
  Administered 2018-01-19 – 2018-01-20 (×3): 4 mg via INTRAVENOUS
  Filled 2018-01-17 (×3): qty 2

## 2018-01-17 MED ORDER — ACETAMINOPHEN 650 MG RE SUPP: 650.0000 mg | Freq: Four times a day (QID) | RECTAL | Status: DC | PRN

## 2018-01-17 MED ORDER — DOCUSATE SODIUM 100 MG PO CAPS
100.0000 mg | ORAL_CAPSULE | Freq: Two times a day (BID) | ORAL | Status: DC
  Administered 2018-01-17 – 2018-01-20 (×7): 100 mg via ORAL
  Filled 2018-01-17 (×9): qty 1

## 2018-01-17 MED ORDER — ACETAMINOPHEN 325 MG PO TABS
650.0000 mg | ORAL_TABLET | Freq: Four times a day (QID) | ORAL | Status: DC | PRN
  Administered 2018-01-20 – 2018-01-25 (×2): 650 mg via ORAL
  Filled 2018-01-17 (×2): qty 2

## 2018-01-17 MED ORDER — HYDROCODONE-ACETAMINOPHEN 5-325 MG PO TABS
1.0000 | ORAL_TABLET | ORAL | Status: DC | PRN
  Administered 2018-01-21 – 2018-01-27 (×8): 1 via ORAL
  Administered 2018-01-28 – 2018-01-29 (×5): 2 via ORAL
  Filled 2018-01-17 (×2): qty 1
  Filled 2018-01-17: qty 2
  Filled 2018-01-17 (×2): qty 1
  Filled 2018-01-17: qty 2
  Filled 2018-01-17 (×2): qty 1
  Filled 2018-01-17 (×2): qty 2
  Filled 2018-01-17 (×2): qty 1
  Filled 2018-01-17: qty 2

## 2018-01-17 MED ORDER — METRONIDAZOLE IN NACL 5-0.79 MG/ML-% IV SOLN: 500.0000 mg | Freq: Three times a day (TID) | INTRAVENOUS | Status: DC

## 2018-01-17 NOTE — ED Notes (Signed)
Respiratory notified vbg sent to lab

## 2018-01-17 NOTE — ED Notes (Signed)
ED TO INPATIENT HANDOFF REPORT  Name/Age/Gender Christine Mcclain 77 y.o. female  Code Status    Code Status Orders  (From admission, onward)         Start     Ordered   01/29/18 1155  Full code  Continuous     2018-01-29 1158        Code Status History    Date Active Date Inactive Code Status Order ID Comments User Context   08/14/2017 1649 08/15/2017 1549 Full Code [id number]  Ihor Austin, MD Inpatient      Home/SNF/Other Home  Chief Complaint abd  pain  Level of Care/Admitting Diagnosis ED Disposition    ED Disposition Condition Comment   Admit  Hospital Area: [facility] [[id number]]  Level of Care: Med-Surg [16]  Diagnosis: Acute colitis [[id number]]  Admitting Physician: Katha Hamming [[id number]]  Attending Physician: Katha Hamming [[id number]]  Estimated length of stay: past midnight tomorrow  Certification:: I certify this patient will need inpatient services for at least 2 midnights  PT Class (Do Not Modify): Inpatient [101]  PT Acc Code (Do Not Modify): Private [1]       Medical History Past Medical History:  Diagnosis Date  . Arthritis   . Asthma   . COPD (chronic obstructive pulmonary disease) ([facility])   . Hyperlipidemia   . Hypertension     Allergies Allergies  Allergen Reactions  . Gabapentin Hives  . Naproxen Hives  . Penicillins Hives    Has patient had a PCN reaction causing immediate rash, facial/tongue/throat swelling, SOB or lightheadedness with hypotension: No Has patient had a PCN reaction causing severe rash involving mucus membranes or skin necrosis: No Has patient had a PCN reaction that required hospitalization: No Has patient had a PCN reaction occurring within the last 10 years: No If all of the above answers are "NO", then may proceed with Cephalosporin use.  . Codeine Rash    IV Location/Drains/Wounds Patient Lines/Drains/Airways Status   Active Line/Drains/Airways    Name:   Placement date:    Placement time:   Site:   Days:   Peripheral IV 09/05/17 Left Antecubital   09/05/17    0754    Antecubital   134   Peripheral IV 2018/01/29 Left Antecubital   01/29/18    0905    Antecubital   less than 1   Peripheral IV 01/29/2018 Left Forearm   01/29/2018    1102    Forearm   less than 1          Labs/Imaging Results for orders placed or performed during the hospital encounter of January 29, 2018 (from the past 48 hour(s))  CBC with Differential     Status: Abnormal   Collection Time: 29-Jan-2018  8:57 AM  Result Value Ref Range   WBC 12.0 (H) 4.0 - 10.5 K/uL   RBC 4.15 3.87 - 5.11 MIL/uL   Hemoglobin 13.8 12.0 - 15.0 g/dL   HCT 62.9 52.8 - 41.3 %   MCV 100.0 80.0 - 100.0 fL   MCH 33.3 26.0 - 34.0 pg   MCHC 33.3 30.0 - 36.0 g/dL   RDW 24.4 01.0 - 27.2 %   Platelets PLATELET CLUMPS NOTED ON SMEAR, UNABLE TO ESTIMATE 150 - 400 K/uL    Comment: Immature Platelet Fraction may be clinically indicated, consider ordering this additional test [id number]    nRBC 0.0 0.0 - 0.2 %   Neutrophils Relative % 82 %   Neutro Abs 9.8 (H) 1.7 - 7.7  K/uL   Lymphocytes Relative 8 %   Lymphs Abs 1.0 0.7 - 4.0 K/uL   Monocytes Relative 9 %   Monocytes Absolute 1.1 (H) 0.1 - 1.0 K/uL   Eosinophils Relative 0 %   Eosinophils Absolute 0.0 0.0 - 0.5 K/uL   Basophils Relative 0 %   Basophils Absolute 0.0 0.0 - 0.1 K/uL   Immature Granulocytes 1 %   Abs Immature Granulocytes 0.11 (H) 0.00 - 0.07 K/uL    Comment: Performed at [facility], [address] [place], [place] [address]  Brain natriuretic peptide     Status: None   Collection Time: 01/08/2018  8:57 AM  Result Value Ref Range   B Natriuretic Peptide 87.0 0.0 - 100.0 pg/mL    Comment: Performed at [facility], [address] [place], [place] [address]  Troponin I - Once     Status: Abnormal   Collection Time: 01/09/2018  8:57 AM  Result Value Ref Range   Troponin I 0.10 (HH) <0.03 ng/mL    Comment: CRITICAL RESULT CALLED TO, READ BACK  BY AND VERIFIED WITH Kingstin Heims ON 01/21/2018 AT 0947 QSD Performed at [facility], [address] [place], [place] [address]   Blood gas, venous     Status: Abnormal (Preliminary result)   Collection Time: 01/11/2018  8:57 AM  Result Value Ref Range   pH, Ven 7.30 7.250 - 7.430   pCO2, Ven 62 (H) 44.0 - 60.0 mmHg   pO2, Ven PENDING 32.0 - 45.0 mmHg   Patient temperature 37.0    Collection site VENOUS    Sample type VENOUS     Comment: Performed at [facility], [address] [place], [place] [address]  Comprehensive metabolic panel     Status: Abnormal   Collection Time: 01/13/2018  8:57 AM  Result Value Ref Range   Sodium 134 (L) 135 - 145 mmol/L   Potassium 5.2 (H) 3.5 - 5.1 mmol/L    Comment: HEMOLYSIS AT THIS LEVEL MAY AFFECT RESULT   Chloride 97 (L) 98 - 111 mmol/L   CO2 29 22 - 32 mmol/L   Glucose, Bld 112 (H) 70 - 99 mg/dL   BUN 51 (H) 8 - 23 mg/dL   Creatinine, Ser 2.721.41 (H) 0.44 - 1.00 mg/dL   Calcium 8.6 (L) 8.9 - 10.3 mg/dL   Total Protein 6.0 (L) 6.5 - 8.1 g/dL   Albumin 2.7 (L) 3.5 - 5.0 g/dL   AST 536273 (H) 15 - 41 U/L   ALT 79 (H) 0 - 44 U/L   Alkaline Phosphatase 51 38 - 126 U/L   Total Bilirubin 0.7 0.3 - 1.2 mg/dL   GFR calc non Af Amer 35 (L) >60 mL/min   GFR calc Af Amer 41 (L) >60 mL/min   Anion gap 8 5 - 15    Comment: Performed at [facility], [address] [place], [place] [address]  Type and screen Ordered by PROVIDER DEFAULT     Status: None (Preliminary result)   Collection Time: 01/08/2018  9:56 AM  Result Value Ref Range   ABO/RH(D) PENDING    Antibody Screen PENDING    Sample Expiration      01/20/2018 Performed at [facility], [address] [place], [place] [address]   Type and screen     Status: None (Preliminary result)   Collection Time: 01/09/2018 11:41 AM  Result Value Ref Range   ABO/RH(D) PENDING    Antibody Screen  PENDING    Sample Expiration      01/20/2018 Performed at [facility], [address] [place], [place] [address]    Dg Chest 2 View  Result Date: 12/25/2017 CLINICAL DATA:  Left lower quadrant pain. EXAM: CHEST - 2 VIEW COMPARISON:  Eight/14/19 FINDINGS: Normal heart size. Aortic atherosclerosis. No pleural effusion or edema. No airspace opacities. Asymmetric elevation of left hemidiaphragm is again noted. No acute bone abnormalities noted. Spondylosis identified within the thoracic spine. IMPRESSION: No active cardiopulmonary disease. Aortic Atherosclerosis (ICD10-I70.0). Electronically Signed   By: Signa Kellaylor  Stroud M.D.   On: 12/25/2017 09:14   Ct Abdomen Pelvis W Contrast  Result Date: 12/28/2017 CLINICAL DATA:  LEFT lower quadrant pain. Nausea. PET-CT 02/16/2016 EXAM: CT ABDOMEN AND PELVIS WITH CONTRAST TECHNIQUE: Multidetector CT imaging of the abdomen and pelvis was performed using the standard protocol following bolus administration of intravenous contrast. CONTRAST:  60mL OMNIPAQUE IOHEXOL 300 MG/ML  SOLN COMPARISON:  PET-CT 02/16/2016, CT chest 09/05/2017 FINDINGS: Lower chest: Mild nodular airspace disease in the RIGHT lower lobe is new from comparison CT 09/05/2017 Hepatobiliary: No focal hepatic lesion. Mild periportal edema. Postcholecystectomy. Pancreas: Pancreas is normal. No ductal dilatation. No pancreatic inflammation. Spleen: Normal spleen Adrenals/urinary tract: Adrenal glands normal. Benign cyst of the LEFT kidney. No renal obstruction. Exophytic from the lower pole of the RIGHT kidney there is a ovoid high-density mass measuring 2.7 cm with HU equal 70. High-density lesion on comparison noncontrast PET-CT scan of 02/16/2016 at equal density; however, lesion was smaller measuring 2.2 by 1.8 cm. Lesion had no significant metabolic activity at that time. No renal obstruction. There is moderate volume of gas within the bladder collecting non dependently (image 62/6). Stomach/Bowel: Stomach, small-bowel normal. Post appendectomy. There is mild  thickening of the ascending colon. Small amount fluid surrounding the ascending colon. Transverse descending colon appear normal. Rectosigmoid colon normal. Vascular/Lymphatic: Abdominal aorta is normal caliber with atherosclerotic calcification. There is no retroperitoneal or periportal lymphadenopathy. No pelvic lymphadenopathy. Reproductive: Post hysterectomy Other: Moderate amount of simple fluid the posterior cul-de-sac. Musculoskeletal: no aggressive osseous lesion. Severe degenerate changes of the lower lumbar spine. IMPRESSION: 1. New nodular airspace disease in the RIGHT lower lobe could represent aspiration pneumonitis or pneumonia. 2. Mild thickening of the ascending colon suggests segmental colitis. Differential would include inflammatory bowel disease, infectious colitis, drug induced colitis, and ischemic colitis. 3. Moderate volume of gas within the bladder. Recommend correlation with instrumentation. 4. High-density mass exophytic from the lower pole of the RIGHT kidney is indeterminate. Recommend MRI with and without contrast for further evaluation. Electronically Signed   By: Genevive BiStewart  Edmunds M.D.   On: 01/09/2018 10:51    Pending Labs Unresulted Labs (From admission, onward)    Start     Ordered   01/18/18 0500  Basic metabolic panel  Tomorrow morning,   STAT     01/18/2018 1158   01/18/18 0500  CBC  Tomorrow morning,   STAT     12/28/2017 1158   12/26/2017 1112  Urinalysis, Complete w Microscopic  Once,   STAT     12/27/2017 1111          Vitals/Pain Today's Vitals   01/09/2018 1000 12/24/2017 1015 01/13/2018 1115 01/21/2018 1130  BP: 93/67 (!) 100/58 (!) 111/59 (!) 93/53  Pulse:   88 82  Resp: (!) 24 20 (!) 25 (!) 23  Temp:      TempSrc:      SpO2:   (!) 87% Marland Kitchen(!)  87%  Weight:      Height:      PainSc:        Isolation Precautions No active isolations  Medications Medications  levofloxacin (LEVAQUIN) IVPB 750 mg (750 mg Intravenous New Bag/Given January 19, 2018 1153)  0.9 %  sodium  chloride infusion (has no administration in time range)  acetaminophen (TYLENOL) tablet 650 mg (has no administration in time range)    Or  acetaminophen (TYLENOL) suppository 650 mg (has no administration in time range)  HYDROcodone-acetaminophen (NORCO/VICODIN) 5-325 MG per tablet 1-2 tablet (has no administration in time range)  traZODone (DESYREL) tablet 25 mg (has no administration in time range)  docusate sodium (COLACE) capsule 100 mg (has no administration in time range)  bisacodyl (DULCOLAX) EC tablet 5 mg (has no administration in time range)  ondansetron (ZOFRAN) tablet 4 mg (has no administration in time range)    Or  ondansetron (ZOFRAN) injection 4 mg (has no administration in time range)  pantoprazole (PROTONIX) injection 40 mg (has no administration in time range)  metroNIDAZOLE (FLAGYL) IVPB 500 mg (has no administration in time range)  iohexol (OMNIPAQUE) 300 MG/ML solution 60 mL (60 mLs Intravenous Contrast Given 01/19/2018 1031)  0.9 %  sodium chloride infusion ( Intravenous New Bag/Given 2018-01-19 1146)  ondansetron (ZOFRAN) injection 4 mg (4 mg Intravenous Given 01-19-2018 1147)    Mobility

## 2018-01-17 NOTE — H&P (Signed)
[facility] [employer] at [facility]   PATIENT NAME: Christine Mcclain    MR#:  [id number]  DATE OF BIRTH:  06/12/1940  DATE OF ADMISSION:  12/24/2017  PRIMARY CARE PHYSICIAN: Hillery AldoPatel, Sarah, MD   REQUESTING/REFERRING PHYSICIAN: Dr. Daryel NovemberJonathan Williams  CHIEF COMPLAINT: Abdominal pain   Chief Complaint  Patient presents with  . Abdominal Pain    HISTORY OF PRESENT ILLNESS:  Christine Mcclain  is a 77 y.o. female with a known history of COPD, on chronic oxygen 2 L all the time, arthritis, essential hypertension, GERD recently started on PPIs on Monday started to have left lower quadrant abdominal pain for last 3 to 4 days associated with nausea, denies any diarrhea.  Brought in by EMS because abdominal pain, passing black stool.  Patient denied any complaints of black stool to me.  Patient supposed to be on 2 L of oxygen, according to family she is not eating for the past 3 to 4 days.  PAST MEDICAL HISTORY:   Past Medical History:  Diagnosis Date  . Arthritis   . Asthma   . COPD (chronic obstructive pulmonary disease) (HCC)   . Hyperlipidemia   . Hypertension     PAST SURGICAL HISTOIRY:   Past Surgical History:  Procedure Laterality Date  . ABDOMINAL HYSTERECTOMY    . APPENDECTOMY    . BACK SURGERY    . CHOLECYSTECTOMY    . NECK SURGERY      SOCIAL HISTORY:   Social History   Tobacco Use  . Smoking status: Current Every Day Smoker    Packs/day: 1.50    Years: 62.00    Pack years: 93.00    Types: Cigarettes  . Smokeless tobacco: Never Used  Substance Use Topics  . Alcohol use: No    FAMILY HISTORY:   Family History  Problem Relation Age of Onset  . Diabetes Mother   . Hyperlipidemia Father   . Hypertension Father   . Heart disease Father   . Emphysema Sister     DRUG ALLERGIES:   Allergies  Allergen Reactions  . Gabapentin Hives  . Naproxen Hives  . Penicillins Hives    Has patient had a PCN reaction causing immediate rash,  facial/tongue/throat swelling, SOB or lightheadedness with hypotension: No Has patient had a PCN reaction causing severe rash involving mucus membranes or skin necrosis: No Has patient had a PCN reaction that required hospitalization: No Has patient had a PCN reaction occurring within the last 10 years: No If all of the above answers are "NO", then may proceed with Cephalosporin use.  . Codeine Rash    REVIEW OF SYSTEMS:  CONSTITUTIONAL: No fever, fatigue or weakness.  EYES: No blurred or double vision.  EARS, NOSE, AND THROAT: No tinnitus or ear pain.  RESPIRATORY: No cough, shortness of breath, wheezing or hemoptysis.  CARDIOVASCULAR: No chest pain, orthopnea, edema.  GASTROINTESTINAL: No nausea, vomiting, diarrhea or abdominal pain.  GENITOURINARY: No dysuria, hematuria.  ENDOCRINE: No polyuria, nocturia,  HEMATOLOGY: No anemia, easy bruising or bleeding SKIN: No rash or lesion. MUSCULOSKELETAL: No joint pain or arthritis.   NEUROLOGIC: No tingling, numbness, weakness.  PSYCHIATRY: No anxiety or depression.   MEDICATIONS AT HOME:   Prior to Admission medications   Medication Sig Start Date End Date Taking? Authorizing Provider  albuterol (PROVENTIL) (2.5 MG/3ML) 0.083% nebulizer solution Take 3 mLs by nebulization every 4 (four) hours as needed for wheezing or shortness of breath.    Yes [provider]  alendronate (FOSAMAX) 70 MG tablet Take 70 mg by mouth every Saturday.    Yes [provider]  ammonium lactate (AMLACTIN) 12 % cream Apply topically 2 (two) times daily as needed for dry skin.   Yes [provider]  aspirin EC 81 MG tablet Take 81 mg by mouth daily.   Yes [provider]  atenolol (TENORMIN) 50 MG tablet Take 50 mg by mouth daily.   Yes [provider]  atorvastatin (LIPITOR) 80 MG tablet Take 80 mg by mouth at bedtime.   Yes [provider]  budesonide-formoterol (SYMBICORT) 160-4.5 MCG/ACT inhaler Inhale 2  puffs into the lungs 2 (two) times daily.   Yes [provider]  cholecalciferol (VITAMIN D) 1000 units tablet Take 2,000 Units by mouth daily.   Yes [provider]  ferrous sulfate 325 (65 FE) MG tablet Take 325 mg by mouth daily with breakfast.   Yes [provider]  fluticasone (FLONASE) 50 MCG/ACT nasal spray Place 2 sprays into both nostrils daily.   Yes [provider]  furosemide (LASIX) 20 MG tablet Take 20 mg by mouth daily.   Yes [provider]  lisinopril (PRINIVIL,ZESTRIL) 5 MG tablet Take 5 mg by mouth daily.   Yes [provider]  naproxen sodium (ALEVE) 220 MG tablet Take 440 mg by mouth 2 (two) times daily as needed (pain).   Yes [provider]  omeprazole (PRILOSEC) 20 MG capsule Take 20 mg by mouth daily.   Yes [provider]  Potassium 99 MG TABS Take 1 tablet by mouth daily.   Yes [provider]  ranitidine (ZANTAC) 150 MG tablet Take 150 mg by mouth daily.   Yes [provider]  tiotropium (SPIRIVA) 18 MCG inhalation capsule Place 1 capsule into inhaler and inhale daily.   Yes [provider]  nystatin (MYCOSTATIN) 100000 UNIT/ML suspension Take 5 mLs by mouth 4 (four) times daily. 08/06/17   [provider]      VITAL SIGNS:  Blood pressure (!) 96/54, pulse 82, temperature 98.3 F (36.8 C), temperature source Oral, resp. rate (!) 22, height 5\' 3"  (1.6 m), weight 50.3 kg, SpO2 100 %.  PHYSICAL EXAMINATION:  GENERAL:  77 y.o.-year-old patient lying in the bed with no acute distress.  Patient looks emaciated. EYES: Pupils equal, round, reactive to light . No scleral icterus. Extraocular muscles intact.  HEENT: Head atraumatic, normocephalic. Oropharynx and nasopharynx clear.  NECK:  Supple, no jugular venous distention. No thyroid enlargement, no tenderness.  LUNGS: Diminished breath sounds bilaterally. CARDIOVASCULAR: S1, S2 normal. No murmurs, rubs, or gallops.   ABDOMEN: Bowel sounds present, patient has left lower quadrant abdominal tenderness, no organomegaly.  EXTREMITIES: No pedal edema, cyanosis, or clubbing.  NEUROLOGIC: Cranial nerves II through XII are intact. Muscle strength 5/5 in all extremities. Sensation intact. Gait not checked.  PSYCHIATRIC: The patient is alert and oriented x 3.  SKIN: No obvious rash, lesion, or ulcer.   LABORATORY PANEL:                     CBC Recent Labs  Lab 2017/04/28 0857  WBC 12.0*  HGB 13.8  HCT 41.5  PLT PLATELET CLUMPS NOTED ON SMEAR, UNABLE TO ESTIMATE   ------------------------------------------------------------------------------------------------------------------  Chemistries  Recent Labs  Lab 2017/04/28 0857  NA 134*  K 5.2*  CL 97*  CO2 29  GLUCOSE 112*  BUN 51*  CREATININE 1.41*  CALCIUM 8.6*  AST 273*  ALT 79*  ALKPHOS 51  BILITOT 0.7   ------------------------------------------------------------------------------------------------------------------  Cardiac Enzymes Recent Labs  Lab 01/26/2018 0857  TROPONINI 0.10*   ------------------------------------------------------------------------------------------------------------------  RADIOLOGY:  Dg Chest 2 View  Result Date: Jan 30, 2018 CLINICAL DATA:  Left lower quadrant pain. EXAM: CHEST - 2 VIEW COMPARISON:  Eight/14/19 FINDINGS: Normal heart size. Aortic atherosclerosis. No pleural effusion or edema. No airspace opacities. Asymmetric elevation of left hemidiaphragm is again noted. No acute bone abnormalities noted. Spondylosis identified within the thoracic spine. IMPRESSION: No active cardiopulmonary disease. Aortic Atherosclerosis (ICD10-I70.0). Electronically Signed   By: Signa Kell M.D.   On: Jan 30, 2018 09:14   Ct Abdomen Pelvis W Contrast  Result Date: 01/27/2018 CLINICAL DATA:  LEFT lower quadrant pain. Nausea. PET-CT 02/16/2016 EXAM: CT ABDOMEN AND PELVIS WITH CONTRAST TECHNIQUE: Multidetector CT imaging of  the abdomen and pelvis was performed using the standard protocol following bolus administration of intravenous contrast. CONTRAST:  60mL OMNIPAQUE IOHEXOL 300 MG/ML  SOLN COMPARISON:  PET-CT 02/16/2016, CT chest 09/05/2017 FINDINGS: Lower chest: Mild nodular airspace disease in the RIGHT lower lobe is new from comparison CT 09/05/2017 Hepatobiliary: No focal hepatic lesion. Mild periportal edema. Postcholecystectomy. Pancreas: Pancreas is normal. No ductal dilatation. No pancreatic inflammation. Spleen: Normal spleen Adrenals/urinary tract: Adrenal glands normal. Benign cyst of the LEFT kidney. No renal obstruction. Exophytic from the lower pole of the RIGHT kidney there is a ovoid high-density mass measuring 2.7 cm with HU equal 70. High-density lesion on comparison noncontrast PET-CT scan of 02/16/2016 at equal density; however, lesion was smaller measuring 2.2 by 1.8 cm. Lesion had no significant metabolic activity at that time. No renal obstruction. There is moderate volume of gas within the bladder collecting non dependently (image 62/6). Stomach/Bowel: Stomach, small-bowel normal. Post appendectomy. There is mild thickening of the ascending colon. Small amount fluid surrounding the ascending colon. Transverse descending colon appear normal. Rectosigmoid colon normal. Vascular/Lymphatic: Abdominal aorta is normal caliber with atherosclerotic calcification. There is no retroperitoneal or periportal lymphadenopathy. No pelvic lymphadenopathy. Reproductive: Post hysterectomy Other: Moderate amount of simple fluid the posterior cul-de-sac. Musculoskeletal: no aggressive osseous lesion. Severe degenerate changes of the lower lumbar spine. IMPRESSION: 1. New nodular airspace disease in the RIGHT lower lobe could represent aspiration pneumonitis or pneumonia. 2. Mild thickening of the ascending colon suggests segmental colitis. Differential would include inflammatory bowel disease, infectious colitis, drug induced  colitis, and ischemic colitis. 3. Moderate volume of gas within the bladder. Recommend correlation with instrumentation. 4. High-density mass exophytic from the lower pole of the RIGHT kidney is indeterminate. Recommend MRI with and without contrast for further evaluation. Electronically Signed   By: Genevive Bi M.D.   On: 02/19/2018 10:51    EKG:   Orders placed or performed during the hospital encounter of 02/02/2018  . EKG 12-Lead  . EKG 12-Lead  . ED EKG  . ED EKG    IMPRESSION AND PLAN:   77 year old female patient with history of ongoing tobacco abuse, chronic respiratory failure and on 2 L of oxygen comes in because of left lower quadrant abdominal pain for last 3 to 4 days associated with poor p.o. intake, cough, mild shortness of breath.   Assessment and plan 1.  Abdominal pain likely due to colitis: Patient has abdominal pain, blood in the stool unable test for ischemic colitis as well.  Continue IV fluids, conservative treatment, IV antibiotics, see how she does.  Use IV nausea medicines as well.  Start Cipro, Flagyl.  If patient does not get better she needs  mesenteric angiogram. 2.  Essential hypertension: Patient is hypotensive today so hold her BP medicines. 3.  Chronic systolic heart failure, previous EF 30%, followed by [facility] cardiology,  #4 hyperlipidemia: Because of colitis hold the statins. \,  Other p.o. medicines at this time. 5.  History of COPD: No wheezing but has right-sided pneumonia, continue oxygen, use bronchodilators, antibiotics and see if chest x-ray clears up otherwise she needs CT chest to evaluate for malignancy due to her ongoing tobacco abuse.  Patient looks very emaciated #6 .  Acute renal failure due to ATN from dehydration and poor p.o. intake for last 3 to 4 days: Continue IV fluids, avoid nephrotoxic agents. #7 GI bleed, questionable: Patient denied any GI bleed to me to the EMS she mentioned that she is having black stool: Continue  PPIs, hemoglobin stable at this time.  Hold aspirin.  Family is at bedside. All the records are reviewed and case discussed with ED provider. Management plans discussed with the patient, family and they are in agreement.  CODE STATUS: Full code  TOTAL TIME TAKING CARE OF THIS PATIENT: 55 minutes.    Katha Hamming M.D on 12/30/2017 at 11:43 AM  Between 7am to 6pm - Pager - [phone]  After 6pm go to [email or web address] - password [email or web address]  [facility]  Office  [phone]  CC: Primary care physician; Hillery Aldo, MD  Note: This dictation was prepared with Dragon dictation along with smaller phrase technology. Any transcriptional errors that result from this process are unintentional.

## 2018-01-17 NOTE — ED Triage Notes (Signed)
Pt arrive via ems from home. C/o LLQ pain, SOB and nausea x 2 days, ems report tender to palpation. Last BM 2 days ago, black in color. EMS states pt wear oxygen at home, when they arrived at the home pt did not have oxygen on. A&O x 4 on arrival, NAD noted at this time.

## 2018-01-17 NOTE — ED Notes (Signed)
Phlebotomist in room to recollect type and screen

## 2018-01-17 NOTE — ED Provider Notes (Signed)
[facility] Emergency Department Provider Note       Time seen: ----------------------------------------- 8:54 AM on 01/16/2018 -----------------------------------------   I have reviewed the triage vital signs and the nursing notes.  HISTORY   Chief Complaint Abdominal Pain    HPI Christine Mcclain is a 77 y.o. female with a history of arthritis, asthma, COPD, hyperlipidemia, hypertension who presents to the ED for shortness of breath.  Patient arrives by EMS.  EMS states she was not on her home oxygen when they arrived.  She received treatment for COPD including steroids and breathing treatments with some improvement.  She also has been passing black stool and having left lower quadrant abdominal pain.  She denies any other complaints at this time.  Past Medical History:  Diagnosis Date  . Arthritis   . Asthma   . COPD (chronic obstructive pulmonary disease) (HCC)   . Hyperlipidemia   . Hypertension     Patient Active Problem List   Diagnosis Date Noted  . Unstable angina (HCC) 08/14/2017  . Personal history of tobacco use, presenting hazards to health 01/15/2016    Past Surgical History:  Procedure Laterality Date  . ABDOMINAL HYSTERECTOMY    . APPENDECTOMY    . BACK SURGERY    . CHOLECYSTECTOMY    . NECK SURGERY      Allergies Gabapentin; Naproxen; Penicillins; and Codeine  Social History Social History   Tobacco Use  . Smoking status: Current Every Day Smoker    Packs/day: 1.50    Years: 62.00    Pack years: 93.00    Types: Cigarettes  . Smokeless tobacco: Never Used  Substance Use Topics  . Alcohol use: No  . Drug use: No   Review of Systems Constitutional: Negative for fever. Cardiovascular: Negative for chest pain. Respiratory: Positive for shortness of breath and cough Gastrointestinal: Positive for abdominal pain and black stool Musculoskeletal: Negative for back pain. Skin: Negative for rash. Neurological: Negative for  headaches, positive for weakness  All systems negative/normal/unremarkable except as stated in the HPI  ____________________________________________   PHYSICAL EXAM:  VITAL SIGNS: ED Triage Vitals  Enc Vitals Group     BP 01/09/2018 0852 (!) 96/54     Pulse Rate 01/05/2018 0852 82     Resp 12/28/2017 0852 (!) 22     Temp 01/01/2018 0852 98.3 F (36.8 C)     Temp Source 12/25/2017 0852 Oral     SpO2 12/24/2017 0849 95 %     Weight --      Height --      Head Circumference --      Peak Flow --      Pain Score --      Pain Loc --      Pain Edu? --      Excl. in GC? --    Constitutional: Alert and oriented.  Chronically ill-appearing, mild distress Eyes: Conjunctivae are normal. Normal extraocular movements. ENT   Head: Normocephalic and atraumatic.   Nose: No congestion/rhinnorhea.   Mouth/Throat: Mucous membranes are moist.   Neck: No stridor. Cardiovascular: Normal rate, regular rhythm. No murmurs, rubs, or gallops. Respiratory: Diminished breath sounds with rhonchi bilaterally Gastrointestinal: Left lower quadrant tenderness, no rebound or guarding.  Normal bowel sounds. Rectal: Black stool, heme positive Musculoskeletal: Nontender with normal range of motion in extremities. No lower extremity tenderness nor edema. Neurologic:  Normal speech and language. No gross focal neurologic deficits are appreciated.  Skin:  Skin is warm, dry  and intact. No rash noted. Psychiatric: Mood and affect are normal. Speech and behavior are normal.  ____________________________________________  EKG: Interpreted by me.  Sinus rhythm rate 83 bpm, left bundle branch block, left axis deviation, normal QT  ____________________________________________  ED COURSE:  As part of my medical decision making, I reviewed the following data within the electronic MEDICAL RECORD NUMBER History obtained from family if available, nursing notes, old chart and ekg, as well as notes from prior ED  visits. Patient presented for respiratory complaints as well as left lower quadrant pain and black stools, we will assess with labs and imaging as indicated at this time.   Procedures ____________________________________________   LABS (pertinent positives/negatives)  Labs Reviewed  CBC WITH DIFFERENTIAL/PLATELET - Abnormal; Notable for the following components:      Result Value   WBC 12.0 (*)    Neutro Abs 9.8 (*)    Monocytes Absolute 1.1 (*)    Abs Immature Granulocytes 0.11 (*)    All other components within normal limits  TROPONIN I - Abnormal; Notable for the following components:   Troponin I 0.10 (*)    All other components within normal limits  BLOOD GAS, VENOUS - Abnormal; Notable for the following components:   pCO2, Ven 62 (*)    All other components within normal limits  COMPREHENSIVE METABOLIC PANEL - Abnormal; Notable for the following components:   Sodium 134 (*)    Potassium 5.2 (*)    Chloride 97 (*)    Glucose, Bld 112 (*)    BUN 51 (*)    Creatinine, Ser 1.41 (*)    Calcium 8.6 (*)    Total Protein 6.0 (*)    Albumin 2.7 (*)    AST 273 (*)    ALT 79 (*)    GFR calc non Af Amer 35 (*)    GFR calc Af Amer 41 (*)    All other components within normal limits  BRAIN NATRIURETIC PEPTIDE  URINALYSIS, COMPLETE (UACMP) WITH MICROSCOPIC  TYPE AND SCREEN  TYPE AND SCREEN   CRITICAL CARE Performed by: Ulice DashJohnathan E Arthuro Canelo   Total critical care time: 30 minutes  Critical care time was exclusive of separately billable procedures and treating other patients.  Critical care was necessary to treat or prevent imminent or life-threatening deterioration.  Critical care was time spent personally by me on the following activities: development of treatment plan with patient and/or surrogate as well as nursing, discussions with consultants, evaluation of patient's response to treatment, examination of patient, obtaining history from patient or surrogate, ordering and  performing treatments and interventions, ordering and review of laboratory studies, ordering and review of radiographic studies, pulse oximetry and re-evaluation of patient's condition.   RADIOLOGY Images were viewed by me  CT the abdomen pelvis with contrast, chest x-ray IMPRESSION: 1. New nodular airspace disease in the RIGHT lower lobe could represent aspiration pneumonitis or pneumonia. 2. Mild thickening of the ascending colon suggests segmental colitis. Differential would include inflammatory bowel disease, infectious colitis, drug induced colitis, and ischemic colitis. 3. Moderate volume of gas within the bladder. Recommend correlation with instrumentation. 4. High-density mass exophytic from the lower pole of the RIGHT kidney is indeterminate. Recommend MRI with and without contrast for further evaluation. CXR IMPRESSION: No active cardiopulmonary disease.  Aortic Atherosclerosis (ICD10-I70.0).  ____________________________________________  DIFFERENTIAL DIAGNOSIS   COPD, pneumonia, influenza, gastrointestinal bleeding, diverticulitis  FINAL ASSESSMENT AND PLAN  COPD exacerbation, abdominal pain, elevated troponin, pneumonia, colitis   Plan: The patient  had presented for shortness of breath with nausea and abdominal pain. Patient's labs did reveal some dehydration as well as findings suggestive of COPD and an elevated troponin of 0.10.  Troponin is likely demand related.  I have given IV fluids as well as IV Levaquin.  Patient's imaging was concerning for right lower lobe pneumonia.  She also has evidence of colitis of uncertain etiology.  We will also check a urinalysis.  I will discuss with the hospitalist for admission.   Ulice DashJohnathan E Montina Dorrance, MD   Note: This note was generated in part or whole with voice recognition software. Voice recognition is usually quite accurate but there are transcription errors that can and very often do occur. I apologize for any  typographical errors that were not detected and corrected.     Emily FilbertWilliams, Brodee Mauritz E, MD 01/09/2018 [phone]

## 2018-01-17 NOTE — ED Notes (Signed)
Date and time results received: 01/02/2018  Test: troponin Critical Value: 0.10  Name of Provider Notified: [facility]  Orders Received? Or Actions Taken?: MD notified

## 2018-01-18 DIAGNOSIS — E440 Moderate protein-calorie malnutrition: Secondary | ICD-10-CM

## 2018-01-18 LAB — BASIC METABOLIC PANEL
Anion gap: 6 (ref 5–15)
BUN: 50 mg/dL — AB (ref 8–23)
CO2: 26 mmol/L (ref 22–32)
Calcium: 8.1 mg/dL — ABNORMAL LOW (ref 8.9–10.3)
Chloride: 102 mmol/L (ref 98–111)
Creatinine, Ser: 1.47 mg/dL — ABNORMAL HIGH (ref 0.44–1.00)
GFR calc Af Amer: 39 mL/min — ABNORMAL LOW (ref >60)
GFR calc non Af Amer: 34 mL/min — ABNORMAL LOW (ref >60)
Glucose, Bld: 105 mg/dL — ABNORMAL HIGH (ref 70–99)
Potassium: 4.7 mmol/L (ref 3.5–5.1)
Sodium: 134 mmol/L — ABNORMAL LOW (ref 135–145)

## 2018-01-18 LAB — CBC
HCT: 38.6 % (ref 36.0–46.0)
Hemoglobin: 12.6 g/dL (ref 12.0–15.0)
MCH: 32.8 pg (ref 26.0–34.0)
MCHC: 32.6 g/dL (ref 30.0–36.0)
MCV: 100.5 fL — ABNORMAL HIGH (ref 80.0–100.0)
Platelets: UNDETERMINED 10*3/uL (ref 150–400)
RBC: 3.84 MIL/uL — ABNORMAL LOW (ref 3.87–5.11)
RDW: 14.6 % (ref 11.5–15.5)
WBC: 12 10*3/uL — ABNORMAL HIGH (ref 4.0–10.5)
nRBC: 0 % (ref 0.0–0.2)

## 2018-01-18 LAB — RESPIRATORY PANEL BY PCR
Adenovirus: NOT DETECTED
Bordetella pertussis: NOT DETECTED
Chlamydophila pneumoniae: NOT DETECTED
Coronavirus 229E: NOT DETECTED
Coronavirus HKU1: NOT DETECTED
Coronavirus NL63: NOT DETECTED
Coronavirus OC43: NOT DETECTED
Influenza A: NOT DETECTED
Influenza B: NOT DETECTED
METAPNEUMOVIRUS-RVPPCR: NOT DETECTED
Mycoplasma pneumoniae: NOT DETECTED
PARAINFLUENZA VIRUS 4-RVPPCR: NOT DETECTED
Parainfluenza Virus 1: NOT DETECTED
Parainfluenza Virus 2: NOT DETECTED
Parainfluenza Virus 3: NOT DETECTED
Respiratory Syncytial Virus: NOT DETECTED
Rhinovirus / Enterovirus: NOT DETECTED

## 2018-01-18 LAB — INFLUENZA PANEL BY PCR (TYPE A & B)
Influenza A By PCR: NEGATIVE
Influenza B By PCR: NEGATIVE

## 2018-01-18 LAB — GLUCOSE, CAPILLARY: Glucose-Capillary: 86 mg/dL (ref 70–99)

## 2018-01-18 MED ORDER — ADULT MULTIVITAMIN W/MINERALS CH
1.0000 | ORAL_TABLET | Freq: Every day | ORAL | Status: DC
  Administered 2018-01-18 – 2018-01-22 (×4): 1 via ORAL
  Filled 2018-01-18 (×4): qty 1

## 2018-01-18 MED ORDER — FAMOTIDINE 20 MG PO TABS
10.0000 mg | ORAL_TABLET | Freq: Every day | ORAL | Status: DC
  Administered 2018-01-18 – 2018-01-21 (×4): 10 mg via ORAL
  Filled 2018-01-18 (×4): qty 1

## 2018-01-18 MED ORDER — PHENOL 1.4 % MT LIQD
1.0000 | OROMUCOSAL | Status: DC | PRN
  Administered 2018-01-18: 1 via OROMUCOSAL
  Filled 2018-01-18: qty 177

## 2018-01-18 MED ORDER — CIPROFLOXACIN IN D5W 400 MG/200ML IV SOLN
400.0000 mg | INTRAVENOUS | Status: DC
  Administered 2018-01-18: 400 mg via INTRAVENOUS
  Filled 2018-01-18 (×2): qty 200

## 2018-01-18 MED ORDER — VITAMIN C 500 MG PO TABS
250.0000 mg | ORAL_TABLET | Freq: Two times a day (BID) | ORAL | Status: DC
  Administered 2018-01-18 – 2018-01-29 (×18): 250 mg via ORAL
  Filled 2018-01-18: qty 0.5
  Filled 2018-01-18: qty 1
  Filled 2018-01-18: qty 0.5
  Filled 2018-01-18: qty 1
  Filled 2018-01-18 (×2): qty 0.5
  Filled 2018-01-18: qty 1
  Filled 2018-01-18 (×6): qty 0.5
  Filled 2018-01-18: qty 1
  Filled 2018-01-18: qty 0.5
  Filled 2018-01-18: qty 1
  Filled 2018-01-18: qty 0.5
  Filled 2018-01-18: qty 1
  Filled 2018-01-18: qty 0.5
  Filled 2018-01-18 (×2): qty 1
  Filled 2018-01-18 (×3): qty 0.5

## 2018-01-18 MED ORDER — METRONIDAZOLE 500 MG PO TABS
500.0000 mg | ORAL_TABLET | Freq: Three times a day (TID) | ORAL | Status: DC
  Administered 2018-01-19 – 2018-01-22 (×10): 500 mg via ORAL
  Filled 2018-01-18 (×12): qty 1

## 2018-01-18 MED ORDER — TIOTROPIUM BROMIDE MONOHYDRATE 18 MCG IN CAPS
18.0000 ug | ORAL_CAPSULE | Freq: Every day | RESPIRATORY_TRACT | Status: DC
  Administered 2018-01-18 – 2018-01-21 (×4): 18 ug via RESPIRATORY_TRACT
  Filled 2018-01-18: qty 5

## 2018-01-18 MED ORDER — CIPROFLOXACIN HCL 500 MG PO TABS: 250.0000 mg | ORAL_TABLET | Freq: Two times a day (BID) | ORAL | Status: DC

## 2018-01-18 MED ORDER — FERROUS SULFATE 325 (65 FE) MG PO TABS
325.0000 mg | ORAL_TABLET | Freq: Every day | ORAL | Status: DC
  Administered 2018-01-19 – 2018-01-20 (×2): 325 mg via ORAL
  Filled 2018-01-18 (×3): qty 1

## 2018-01-18 MED ORDER — MOMETASONE FURO-FORMOTEROL FUM 200-5 MCG/ACT IN AERO
2.0000 | INHALATION_SPRAY | Freq: Two times a day (BID) | RESPIRATORY_TRACT | Status: DC
  Administered 2018-01-18 – 2018-01-21 (×7): 2 via RESPIRATORY_TRACT
  Filled 2018-01-18: qty 8.8

## 2018-01-18 MED ORDER — ENSURE ENLIVE PO LIQD
237.0000 mL | Freq: Two times a day (BID) | ORAL | Status: DC
  Administered 2018-01-18 – 2018-01-24 (×7): 237 mL via ORAL

## 2018-01-18 NOTE — Care Management Note (Signed)
Case Management Note  Patient Details  Name: Christine Mcclain MRN: [id number] Date of Birth: 03/17/1940  Subjective/Objective:  RNCM team consulted with patient and spouse to assess for any transition of care needs. Patient currently lives with her spouse and is independent with all of her activities of daily living. She does not require any assistive devices but does wear full time chronic O2. She is unsure of what company provides the oxygen and can only tell me it comes from [place]. I questioned whether or not she had adequate supplies because the chart states she called EMS for shortness of breath but she was not wearing her oxygen. She tells me she is good on supplies and has all needed concentrators/tanks but she primarily wears it during exertional activity.  Patient still drives. Sees [facility] All[facility]Patel [facility] and is current with her appointments. Gets medications from [facility] without concern.                   Action/Plan: RNCM to continue to follow for any needs.   Expected Discharge Date:  01/19/18               Expected Discharge Plan:     In-House Referral:     Discharge planning Services     Post Acute Care Choice:    Choice offered to:     DME Arranged:    DME Agency:     HH Arranged:    HH Agency:     Status of Service:     If discussed at [employer] Meetings, dates discussed:    Additional Comments:  Edwinna AreolaJosh A Bennie Scaff, RN 01/18/2018, 10:15 AM

## 2018-01-18 NOTE — Progress Notes (Signed)
[facility] Physicians - [place] at [facility]   PATIENT NAME: Christine Mcclain    MR#:  [id number]  DATE OF BIRTH:  1940-12-19  SUBJECTIVE:  CHIEF COMPLAINT: Tolerating clear liquid diet.  Patient is not sure if she is having black tarry stool.  Abdominal pain is okay denies any nausea or vomiting.  REVIEW OF SYSTEMS:  CONSTITUTIONAL: No fever, fatigue or weakness.  EYES: No blurred or double vision.  EARS, NOSE, AND THROAT: No tinnitus or ear pain.  RESPIRATORY: No cough, shortness of breath, wheezing or hemoptysis.  CARDIOVASCULAR: No chest pain, orthopnea, edema.  GASTROINTESTINAL: No nausea, vomiting, diarrhea or abdominal pain.  GENITOURINARY: No dysuria, hematuria.  ENDOCRINE: No polyuria, nocturia,  HEMATOLOGY: No anemia, easy bruising or bleeding SKIN: No rash or lesion. MUSCULOSKELETAL: No joint pain or arthritis.   NEUROLOGIC: No tingling, numbness, weakness.  PSYCHIATRY: No anxiety or depression.   DRUG ALLERGIES:   Allergies  Allergen Reactions  . Gabapentin Hives  . Naproxen Hives  . Penicillins Hives    Has patient had a PCN reaction causing immediate rash, facial/tongue/throat swelling, SOB or lightheadedness with hypotension: No Has patient had a PCN reaction causing severe rash involving mucus membranes or skin necrosis: No Has patient had a PCN reaction that required hospitalization: No Has patient had a PCN reaction occurring within the last 10 years: No If all of the above answers are "NO", then may proceed with Cephalosporin use.  . Codeine Rash    VITALS:  Blood pressure 106/60, pulse 84, temperature 98.2 F (36.8 C), temperature source Oral, resp. rate (!) 24, height 5\' 3"  (1.6 m), weight 52 kg, SpO2 97 %.  PHYSICAL EXAMINATION:  GENERAL:  77 y.o.-year-old patient lying in the bed with no acute distress.  EYES: Pupils equal, round, reactive to light and accommodation. No scleral icterus. Extraocular muscles intact.  HEENT: Head atraumatic,  normocephalic. Oropharynx and nasopharynx clear.  NECK:  Supple, no jugular venous distention. No thyroid enlargement, no tenderness.  LUNGS: Normal breath sounds bilaterally, no wheezing, rales,rhonchi or crepitation. No use of accessory muscles of respiration.  CARDIOVASCULAR: S1, S2 normal. No murmurs, rubs, or gallops.  ABDOMEN: Soft, nontender, nondistended. Bowel sounds present. Marland Kitchen  EXTREMITIES: No pedal edema, cyanosis, or clubbing.  NEUROLOGIC: Awake, alert and oriented x3 sensation intact. Gait not checked.  PSYCHIATRIC: The patient is alert and oriented x 3.  SKIN: No obvious rash, lesion, or ulcer.    LABORATORY PANEL:   CBC Recent Labs  Lab 01/18/18 0438  WBC 12.0*  HGB 12.6  HCT 38.6  PLT PLATELET CLUMPS NOTED ON SMEAR, UNABLE TO ESTIMATE   ------------------------------------------------------------------------------------------------------------------  Chemistries  Recent Labs  Lab 2018-01-23 0857 01/18/18 0438  NA 134* 134*  K 5.2* 4.7  CL 97* 102  CO2 29 26  GLUCOSE 112* 105*  BUN 51* 50*  CREATININE 1.41* 1.47*  CALCIUM 8.6* 8.1*  AST 273*  --   ALT 79*  --   ALKPHOS 51  --   BILITOT 0.7  --    ------------------------------------------------------------------------------------------------------------------  Cardiac Enzymes Recent Labs  Lab January 23, 2018 0857  TROPONINI 0.10*   ------------------------------------------------------------------------------------------------------------------  RADIOLOGY:  Dg Chest 2 View  Result Date: 23-Jan-2018 CLINICAL DATA:  Left lower quadrant pain. EXAM: CHEST - 2 VIEW COMPARISON:  Eight/14/19 FINDINGS: Normal heart size. Aortic atherosclerosis. No pleural effusion or edema. No airspace opacities. Asymmetric elevation of left hemidiaphragm is again noted. No acute bone abnormalities noted. Spondylosis identified within the thoracic spine. IMPRESSION: No active  cardiopulmonary disease. Aortic Atherosclerosis  (ICD10-I70.0). Electronically Signed   By: Signa Kellaylor  Stroud M.D.   On: 01/08/2018 09:14   Ct Abdomen Pelvis W Contrast  Result Date: 01/08/2018 CLINICAL DATA:  LEFT lower quadrant pain. Nausea. PET-CT 02/16/2016 EXAM: CT ABDOMEN AND PELVIS WITH CONTRAST TECHNIQUE: Multidetector CT imaging of the abdomen and pelvis was performed using the standard protocol following bolus administration of intravenous contrast. CONTRAST:  60mL OMNIPAQUE IOHEXOL 300 MG/ML  SOLN COMPARISON:  PET-CT 02/16/2016, CT chest 09/05/2017 FINDINGS: Lower chest: Mild nodular airspace disease in the RIGHT lower lobe is new from comparison CT 09/05/2017 Hepatobiliary: No focal hepatic lesion. Mild periportal edema. Postcholecystectomy. Pancreas: Pancreas is normal. No ductal dilatation. No pancreatic inflammation. Spleen: Normal spleen Adrenals/urinary tract: Adrenal glands normal. Benign cyst of the LEFT kidney. No renal obstruction. Exophytic from the lower pole of the RIGHT kidney there is a ovoid high-density mass measuring 2.7 cm with HU equal 70. High-density lesion on comparison noncontrast PET-CT scan of 02/16/2016 at equal density; however, lesion was smaller measuring 2.2 by 1.8 cm. Lesion had no significant metabolic activity at that time. No renal obstruction. There is moderate volume of gas within the bladder collecting non dependently (image 62/6). Stomach/Bowel: Stomach, small-bowel normal. Post appendectomy. There is mild thickening of the ascending colon. Small amount fluid surrounding the ascending colon. Transverse descending colon appear normal. Rectosigmoid colon normal. Vascular/Lymphatic: Abdominal aorta is normal caliber with atherosclerotic calcification. There is no retroperitoneal or periportal lymphadenopathy. No pelvic lymphadenopathy. Reproductive: Post hysterectomy Other: Moderate amount of simple fluid the posterior cul-de-sac. Musculoskeletal: no aggressive osseous lesion. Severe degenerate changes of the lower  lumbar spine. IMPRESSION: 1. New nodular airspace disease in the RIGHT lower lobe could represent aspiration pneumonitis or pneumonia. 2. Mild thickening of the ascending colon suggests segmental colitis. Differential would include inflammatory bowel disease, infectious colitis, drug induced colitis, and ischemic colitis. 3. Moderate volume of gas within the bladder. Recommend correlation with instrumentation. 4. High-density mass exophytic from the lower pole of the RIGHT kidney is indeterminate. Recommend MRI with and without contrast for further evaluation. Electronically Signed   By: Genevive BiStewart  Edmunds M.D.   On: 01/08/2018 10:51    EKG:   Orders placed or performed during the hospital encounter of 04-14-2017  . EKG 12-Lead  . EKG 12-Lead  . ED EKG  . ED EKG    ASSESSMENT AND PLAN:   #Acute abdominal pain secondary to acute colitis-probably ischemic colitis Clinically improving Continue IV antibiotics with Cipro and Flagyl Continue IV fluids Advance diet as tolerated currently on soft diet Will consider mesenteric angiogram if clinically deteriorating  #Right lower lobe pneumonia Continue ciprofloxacin Flu test and respiratory panel are negative Bronchodilator treatment as needed  #Questionable melena Monitor hemoglobin and hematocrit and check stool for occult blood Holding aspirin in the interim  #Acute kidney injury from dehydration-ATN Hydrate with IV fluids and monitor renal function and avoid nephrotoxins Creatinine 1.47  #Elevated troponin probably demand ischemia patient is a symptomatic cycle troponins  #Chronic systolic congestive heart failure with previous ejection fraction 30% Not fluid overloaded at this time.  Continue close monitoring as the patient is on IV fluids Follow-up with [facility]ology  #Hyperlipidemia holding statins  #History of COPD no exacerbation Bronchodilator treatment Patient needs a CT chest eventually to rule out malignancy for ongoing  tobacco abuse.  #Tobacco abuse disorder counseled patient to quit smoking for 5 minutes.  She verbalized understanding.  Agreeable with nicotine patch   All the records  are reviewed and case discussed with Care Management/Social Workerr. Management plans discussed with the patient, family and they are in agreement.  CODE STATUS:   TOTAL TIME TAKING CARE OF THIS PATIENT: 35  minutes.   POSSIBLE D/C IN 1- 2  DAYS, DEPENDING ON CLINICAL CONDITION.  Note: This dictation was prepared with Dragon dictation along with smaller phrase technology. Any transcriptional errors that result from this process are unintentional.   Ramonita LabAruna Lovie Agresta M.D on 01/18/2018 at 4:21 PM  Between 7am to 6pm - Pager - [phone] After 6pm go to [email or web address] - password [email or web address] [facility]  [facility]  Office  [phone]  CC: Primary care physician; Hillery AldoPatel, Sarah, MD

## 2018-01-18 NOTE — Progress Notes (Signed)
Pharmacist - Prescriber Communication  Ciprofloxacin dose has been reduced to 400 mg IV Q24H due to creatinine clearance less than 30 ml/min.  Olisa Quesnel A. Dahlia Bailiffookson, PharmD, BCPS Clinical Pharmacist 01/18/2018 16:32

## 2018-01-18 NOTE — Progress Notes (Addendum)
Initial Nutrition Assessment  DOCUMENTATION CODES:   Non-severe (moderate) malnutrition in context of chronic illness  INTERVENTION:   Ensure Enlive po BID, each supplement provides 350 kcal and 20 grams of protein  Magic cup TID with meals, each supplement provides 290 kcal and 9 grams of protein  MVI daily  Vitamin C 22m po BID  NUTRITION DIAGNOSIS:   Moderate Malnutrition related to chronic illness(COPD) as evidenced by moderate fat depletion, moderate to severe muscle depletions.  GOAL:   Patient will meet greater than or equal to 90% of their needs  MONITOR:   PO intake, Supplement acceptance, Labs, Weight trends, Skin, I & O's  REASON FOR ASSESSMENT:   Malnutrition Screening Tool    ASSESSMENT:   77y.o. female with a known history of COPD, on chronic oxygen 2 L all the time, arthritis, essential hypertension, GERD recently started on PPIs presents with left lower quadrant abdominal pain for last 3 to 4 days associated with nausea, denies any diarrhea.   Met with pt in room today. Patient familiar to nutrition department from previous admit. Pt reports fair appetite an oral intake at baseline; family in background shaking their heads "no" that she does not eat well. Pt does enjoy strawberry Ensure and would like to have these while in hospital. Pt does take vitamin D daily at home but does not take any multivitamin. Per chart, pt with 19lb(14%) weight loss from March to July but appears to be weight stable since July. RD will add supplements and MVI to help pt meet her estimated needs. RD will add vitamin C as pt with ecchymosis and poor appetite. Pt ate 85% of her breakfast this morning which included chicken broth, jello and italian ice (of note, pt does not like lime jello). Pt advanced to a soft diet for lunch; pt has ordered pot roast and mashed potatoes.   Medications reviewed and include: colace, protonix, NaCl _0 /hr, metronidazole  Labs reviewed: Na  134(L), BUN 50(H), creat 1.47(H) Wbc- 12.0(H)  NUTRITION - FOCUSED PHYSICAL EXAM:    Most Recent Value  Orbital Region  Mild depletion  Upper Arm Region  Moderate depletion  Thoracic and Lumbar Region  Moderate depletion  Buccal Region  Moderate depletion  Temple Region  Moderate depletion  Clavicle Bone Region  Severe depletion  Clavicle and Acromion Bone Region  Severe depletion  Scapular Bone Region  Moderate depletion  Dorsal Hand  Moderate depletion  Patellar Region  Moderate depletion  Anterior Thigh Region  Moderate depletion  Posterior Calf Region  Moderate depletion  Edema (RD Assessment)  None  Hair  Reviewed  Eyes  Reviewed  Mouth  Reviewed  Skin  Reviewed  Nails  Reviewed     Diet Order:   Diet Order            DIET SOFT Room service appropriate? Yes; Fluid consistency: Thin  Diet effective now             EDUCATION NEEDS:   Education needs have been addressed  Skin:  Skin Assessment: Reviewed RN Assessment(ecchymosis )  Last BM:  12/25  Height:   Ht Readings from Last 1 Encounters:  12/24/2017 _1  (1.6 m)    Weight:   Wt Readings from Last 1 Encounters:  01/18/18 52 kg    Ideal Body Weight:  52.3 kg  BMI:  Body mass index is 20.3 kg/m.  Estimated Nutritional Needs:   Kcal:  1300-1500kcal/day   Protein:  77-88g/day   Fluid:  >  1.3L/day   Koleen Distance MS, RD, LDN Pager #- [phone] Office#- [phone] After Hours Pager: [phone]

## 2018-01-19 LAB — CBC
HCT: 41 % (ref 36.0–46.0)
Hemoglobin: 13.5 g/dL (ref 12.0–15.0)
MCH: 33.1 pg (ref 26.0–34.0)
MCHC: 32.9 g/dL (ref 30.0–36.0)
MCV: 100.5 fL — AB (ref 80.0–100.0)
Platelets: UNDETERMINED 10*3/uL (ref 150–400)
RBC: 4.08 MIL/uL (ref 3.87–5.11)
RDW: 14.6 % (ref 11.5–15.5)
WBC: 9.6 10*3/uL (ref 4.0–10.5)
nRBC: 0 % (ref 0.0–0.2)

## 2018-01-19 LAB — BASIC METABOLIC PANEL
Anion gap: 5 (ref 5–15)
BUN: 48 mg/dL — ABNORMAL HIGH (ref 8–23)
CO2: 23 mmol/L (ref 22–32)
Calcium: 8.1 mg/dL — ABNORMAL LOW (ref 8.9–10.3)
Chloride: 106 mmol/L (ref 98–111)
Creatinine, Ser: 1.41 mg/dL — ABNORMAL HIGH (ref 0.44–1.00)
GFR calc Af Amer: 41 mL/min — ABNORMAL LOW (ref >60)
GFR calc non Af Amer: 35 mL/min — ABNORMAL LOW (ref >60)
Glucose, Bld: 119 mg/dL — ABNORMAL HIGH (ref 70–99)
Potassium: 5.2 mmol/L — ABNORMAL HIGH (ref 3.5–5.1)
Sodium: 134 mmol/L — ABNORMAL LOW (ref 135–145)

## 2018-01-19 LAB — GLUCOSE, CAPILLARY: Glucose-Capillary: 106 mg/dL — ABNORMAL HIGH (ref 70–99)

## 2018-01-19 MED ORDER — CIPROFLOXACIN HCL 500 MG PO TABS
500.0000 mg | ORAL_TABLET | Freq: Every day | ORAL | Status: DC
  Administered 2018-01-19 – 2018-01-21 (×3): 500 mg via ORAL
  Filled 2018-01-19 (×4): qty 1

## 2018-01-19 MED ORDER — PANTOPRAZOLE SODIUM 40 MG PO TBEC
40.0000 mg | DELAYED_RELEASE_TABLET | Freq: Every day | ORAL | Status: DC
  Administered 2018-01-20 – 2018-01-21 (×2): 40 mg via ORAL
  Filled 2018-01-19 (×2): qty 1

## 2018-01-19 NOTE — Progress Notes (Signed)
PHARMACIST - PHYSICIAN COMMUNICATION DR:   Elpidio AnisSudini CONCERNING: Antibiotic IV to Oral Route Change Policy  RECOMMENDATION: This patient is receiving ciprofloxacin by the intravenous route.  Based on criteria approved by the Pharmacy and Therapeutics Committee, the antibiotic(s) is/are being converted to the equivalent oral dose form(s).   DESCRIPTION: These criteria include:  Patient being treated for a respiratory tract infection, urinary tract infection, cellulitis or clostridium difficile associated diarrhea if on metronidazole  The patient is not neutropenic and does not exhibit a GI malabsorption state  The patient is eating (either orally or via tube) and/or has been taking other orally administered medications for a least 24 hours  The patient is improving clinically and has a Tmax < 100.5  If you have questions about this conversion, please contact the Pharmacy Department  []   [phone]( [phone] )  [facility] [x]   [phone]( [phone] )  [facility] []   [phone]( [phone] )  [facility] []   [phone]( [phone] )  [facility] []   [phone]( [phone] )  [facility]

## 2018-01-19 NOTE — Progress Notes (Signed)
[facility] - [place] at [facility]   PATIENT NAME: Christine Mcclain    MR#:  [id number]  DATE OF BIRTH:  10/12/1940  SUBJECTIVE:   Abdominal pain is improving Mild diarrhea.  No vomiting. Afebrile  REVIEW OF SYSTEMS:  CONSTITUTIONAL: No fever, fatigue or weakness.  EYES: No blurred or double vision.  EARS, NOSE, AND THROAT: No tinnitus or ear pain.  RESPIRATORY: No cough, shortness of breath, wheezing or hemoptysis.  CARDIOVASCULAR: No chest pain, orthopnea, edema.  GASTROINTESTINAL: No nausea, vomiting.  Abdominal pain present GENITOURINARY: No dysuria, hematuria.  ENDOCRINE: No polyuria, nocturia,  HEMATOLOGY: No anemia, easy bruising or bleeding SKIN: No rash or lesion. MUSCULOSKELETAL: No joint pain or arthritis.   NEUROLOGIC: No tingling, numbness, weakness.  PSYCHIATRY: No anxiety or depression.   DRUG ALLERGIES:   Allergies  Allergen Reactions  . Gabapentin Hives  . Naproxen Hives  . Penicillins Hives    Has patient had a PCN reaction causing immediate rash, facial/tongue/throat swelling, SOB or lightheadedness with hypotension: No Has patient had a PCN reaction causing severe rash involving mucus membranes or skin necrosis: No Has patient had a PCN reaction that required hospitalization: No Has patient had a PCN reaction occurring within the last 10 years: No If all of the above answers are "NO", then may proceed with Cephalosporin use.  . Codeine Rash    VITALS:  Blood pressure (!) 88/44, pulse 93, temperature 98 F (36.7 C), temperature source Oral, resp. rate 16, height 5\' 3"  (1.6 m), weight 56.2 kg, SpO2 98 %.  PHYSICAL EXAMINATION:  GENERAL:  77 y.o.-year-old patient lying in the bed with no acute distress.  EYES: Pupils equal, round, reactive to light and accommodation. No scleral icterus. Extraocular muscles intact.  HEENT: Head atraumatic, normocephalic. Oropharynx and nasopharynx clear.  NECK:  Supple, no jugular venous  distention. No thyroid enlargement, no tenderness.  LUNGS: Normal breath sounds bilaterally, no wheezing, rales,rhonchi or crepitation. No use of accessory muscles of respiration.  CARDIOVASCULAR: S1, S2 normal. No murmurs, rubs, or gallops.  ABDOMEN: Soft, nontender, nondistended. Bowel sounds present. Marland Kitchen.  EXTREMITIES: No pedal edema, cyanosis, or clubbing.  NEUROLOGIC: Awake, alert and oriented x3 sensation intact. Gait not checked.  PSYCHIATRIC: The patient is alert and oriented x 3.  SKIN: No obvious rash, lesion, or ulcer.    LABORATORY PANEL:   CBC Recent Labs  Lab 01/19/18 0506  WBC 9.6  HGB 13.5  HCT 41.0  PLT PLATELET CLUMPS NOTED ON SMEAR, UNABLE TO ESTIMATE   ------------------------------------------------------------------------------------------------------------------  Chemistries  Recent Labs  Lab 01/02/2018 0857  01/19/18 0506  NA 134*   < > 134*  K 5.2*   < > 5.2*  CL 97*   < > 106  CO2 29   < > 23  GLUCOSE 112*   < > 119*  BUN 51*   < > 48*  CREATININE 1.41*   < > 1.41*  CALCIUM 8.6*   < > 8.1*  AST 273*  --   --   ALT 79*  --   --   ALKPHOS 51  --   --   BILITOT 0.7  --   --    < > = values in this interval not displayed.   ------------------------------------------------------------------------------------------------------------------  Cardiac Enzymes Recent Labs  Lab 01/08/2018 0857  TROPONINI 0.10*   ------------------------------------------------------------------------------------------------------------------  RADIOLOGY:  No results found.  EKG:   Orders placed or performed during the hospital encounter of 01/08/2018  . EKG 12-Lead  .  EKG 12-Lead  . ED EKG  . ED EKG    ASSESSMENT AND PLAN:   # Acute ascending colitis Continue IV ciprofloxacin and Flagyl. Tolerating liquids.  Abdominal pain is improving.  No blood in stool.  # Right lower lobe pneumonia On antibiotics.  Flu negative.  Nebulizers as needed. #Questionable  melena Monitor hemoglobin and hematocrit and check stool for occult blood Holding aspirin in the interim  # Acute kidney injury from dehydration-ATN Continue IV fluids.  Monitor input and output.  #Elevated troponin probably demand ischemia patient  No significant increase  #Chronic systolic congestive heart failure with previous ejection fraction 30% Not fluid overloaded at this time.  Continue close monitoring as the patient is on IV fluids Follow-up with [facility] cardiology  #Right renal mass.  Will need outpatient MRI.  Discussed with patient and family at bedside.  #Hyperlipidemia holding statins  #History of COPD no exacerbation Bronchodilator treatment Patient needs a CT chest eventually to rule out malignancy for ongoing tobacco abuse.  #Tobacco abuse disorder counseled on admission  All the records are reviewed and case discussed with Care Management/Social Workerr. Management plans discussed with the patient, family and they are in agreement.  CODE STATUS:   TOTAL TIME TAKING CARE OF THIS PATIENT: 35  minutes.   POSSIBLE D/C IN 1- 2  DAYS, DEPENDING ON CLINICAL CONDITION.  Note: This dictation was prepared with Dragon dictation along with smaller phrase technology. Any transcriptional errors that result from this process are unintentional.   Orie FishermanSrikar R Dnaiel Voller M.D on 01/19/2018 at 2:32 PM  Between 7am to 6pm - Pager - [phone]  After 6pm go to [email or web address] - password [email or web address] [facility]  [facility]  Office  [phone]  CC: Primary care physician; Hillery AldoPatel, Sarah, MD

## 2018-01-19 NOTE — Progress Notes (Signed)
PHARMACIST - PHYSICIAN COMMUNICATION  DR:   Elpidio AnisSudini  CONCERNING: IV to Oral Route Change Policy  RECOMMENDATION: This patient is receiving pantoprazole by the intravenous route.  Based on criteria approved by the Pharmacy and Therapeutics Committee, the intravenous medication(s) is/are being converted to the equivalent oral dose form(s).   DESCRIPTION: These criteria include:  The patient is eating (either orally or via tube) and/or has been taking other orally administered medications for a least 24 hours  The patient has no evidence of active gastrointestinal bleeding or impaired GI absorption (gastrectomy, short bowel, patient on TNA or NPO).  If you have questions about this conversion, please contact the Pharmacy Department  []   [phone]( [phone] )  [facility] [x]   [phone]( [phone] )  [facility] []   [phone] )  [facility] []   [phone]( [phone] )  [facility] []   [phone]( [phone] )  [facility]   Carola Frostathan A Mialee Weyman, [facility] Center[facility]PH 01/19/2018 1:31 PM

## 2018-01-20 LAB — BASIC METABOLIC PANEL
Anion gap: 5 (ref 5–15)
BUN: 55 mg/dL — ABNORMAL HIGH (ref 8–23)
CO2: 22 mmol/L (ref 22–32)
Calcium: 8.2 mg/dL — ABNORMAL LOW (ref 8.9–10.3)
Chloride: 108 mmol/L (ref 98–111)
Creatinine, Ser: 1.6 mg/dL — ABNORMAL HIGH (ref 0.44–1.00)
GFR calc Af Amer: 35 mL/min — ABNORMAL LOW (ref >60)
GFR calc non Af Amer: 30 mL/min — ABNORMAL LOW (ref >60)
Glucose, Bld: 126 mg/dL — ABNORMAL HIGH (ref 70–99)
Potassium: 5.3 mmol/L — ABNORMAL HIGH (ref 3.5–5.1)
Sodium: 135 mmol/L (ref 135–145)

## 2018-01-20 LAB — GLUCOSE, CAPILLARY: Glucose-Capillary: 101 mg/dL — ABNORMAL HIGH (ref 70–99)

## 2018-01-20 MED ORDER — SODIUM ZIRCONIUM CYCLOSILICATE 10 G PO PACK
10.0000 g | PACK | Freq: Two times a day (BID) | ORAL | Status: AC
  Administered 2018-01-20 (×2): 10 g via ORAL
  Filled 2018-01-20 (×2): qty 1

## 2018-01-20 NOTE — Progress Notes (Signed)
Advance care planning  Purpose of Encounter Colitis, renal mass  Parties in Attendance Patient, daughter and grand daughter at bedside  Patients Decisional capacity Alert and oriented. Able to make decisions. Has no documented HCAPOA. Wants her daughters to make decisions. No advance directives in place.  Discussed regarding colitis and treatment plan.  Renal mass need close OP f/u and abd MRI. Patient verbalized understanding  Code status discussed and wants to be a full code  Time spent - 17 minutes

## 2018-01-20 NOTE — Progress Notes (Addendum)
[facility] [place] at [facility]   PATIENT NAME: Christine Mcclain    MR#:  [id number]  DATE OF BIRTH:  03/08/1940  SUBJECTIVE:   Abdominal pain is improving 1 episode loose stool overnight.  REVIEW OF SYSTEMS:  CONSTITUTIONAL: No fever, fatigue or weakness.  EYES: No blurred or double vision.  EARS, NOSE, AND THROAT: No tinnitus or ear pain.  RESPIRATORY: No cough, shortness of breath, wheezing or hemoptysis.  CARDIOVASCULAR: No chest pain, orthopnea, edema.  GASTROINTESTINAL: No nausea, vomiting.  Abdominal pain present. GENITOURINARY: No dysuria, hematuria.  ENDOCRINE: No polyuria, nocturia.  HEMATOLOGY: No anemia, easy bruising or bleeding. SKIN: No rash or lesion. MUSCULOSKELETAL: No joint pain or arthritis.   NEUROLOGIC: No tingling, numbness, weakness.  PSYCHIATRY: No anxiety or depression.   DRUG ALLERGIES:   Allergies  Allergen Reactions  . Gabapentin Hives  . Naproxen Hives  . Penicillins Hives    Has patient had a PCN reaction causing immediate rash, facial/tongue/throat swelling, SOB or lightheadedness with hypotension: No Has patient had a PCN reaction causing severe rash involving mucus membranes or skin necrosis: No Has patient had a PCN reaction that required hospitalization: No Has patient had a PCN reaction occurring within the last 10 years: No If all of the above answers are "NO", then may proceed with Cephalosporin use.  . Codeine Rash    VITALS:  Blood pressure (!) 95/53, pulse (!) 46, temperature (!) 97.5 F (36.4 C), temperature source Oral, resp. rate 20, height 5\' 3"  (1.6 m), weight 54.4 kg, SpO2 92 %.  PHYSICAL EXAMINATION:  GENERAL:  77 y.o.-year-old patient lying in the bed with no acute distress.  EYES: Pupils equal, round, reactive to light and accommodation. No scleral icterus. Extraocular muscles intact.  HEENT: Head atraumatic, normocephalic. Oropharynx and nasopharynx clear.  NECK:  Supple, no jugular venous  distention. No thyroid enlargement, no tenderness.  LUNGS: Mild wheezing CARDIOVASCULAR: S1, S2 normal. No murmurs, rubs, or gallops.  ABDOMEN: Soft, nontender, nondistended. Bowel sounds present.  EXTREMITIES: No pedal edema, cyanosis, or clubbing.  NEUROLOGIC: Awake, alert and oriented x3 sensation intact. Gait not checked.  PSYCHIATRIC: The patient is alert and oriented x 3.  SKIN: No obvious rash, lesion, or ulcer. Skin tags around recturm    LABORATORY PANEL:   CBC Recent Labs  Lab 01/19/18 0506  WBC 9.6  HGB 13.5  HCT 41.0  PLT PLATELET CLUMPS NOTED ON SMEAR, UNABLE TO ESTIMATE   ------------------------------------------------------------------------------------------------------------------  Chemistries  Recent Labs  Lab 01/03/2018 0857  01/20/18 0409  NA 134*   < > 135  K 5.2*   < > 5.3*  CL 97*   < > 108  CO2 29   < > 22  GLUCOSE 112*   < > 126*  BUN 51*   < > 55*  CREATININE 1.41*   < > 1.60*  CALCIUM 8.6*   < > 8.2*  AST 273*  --   --   ALT 79*  --   --   ALKPHOS 51  --   --   BILITOT 0.7  --   --    < > = values in this interval not displayed.   ------------------------------------------------------------------------------------------------------------------  Cardiac Enzymes Recent Labs  Lab 01/09/2018 0857  TROPONINI 0.10*   ------------------------------------------------------------------------------------------------------------------  RADIOLOGY:  No results found.  EKG:   Orders placed or performed during the hospital encounter of 01/15/2018  . EKG 12-Lead  . EKG 12-Lead  . ED EKG  . ED  EKG    ASSESSMENT AND PLAN:   # Acute ascending colitis Continue  ciprofloxacin and Flagyl. Tolerating liquids.  Abdominal pain is improving.  No blood in stool.  # Right lower lobe pneumonia On antibiotics.  Flu negative.  Nebulizers as needed.  #Questionable melena Hemoglobin is stable  # Acute kidney injury from dehydration - ATN Continue IV  fluids.  Monitor input and output. Some wheezing but is chronic  #Elevated troponin probably demand ischemia patient  No significant increase  #Chronic systolic congestive heart failure with previous ejection fraction 30% Not fluid overloaded at this time.  Continue close monitoring as the patient is on IV fluids Follow-up with [facility] cardiology  #Right renal mass.  Will need outpatient MRI.  Discussed with patient and family at bedside.  #Hyperlipidemia holding statins  #History of COPD no exacerbation Bronchodilator treatment Patient needs a CT chest eventually to rule out malignancy for ongoing tobacco abuse.  #Tobacco abuse disorder counseled on admission    All the records are reviewed and case discussed with Care Management/Social Workerr. Management plans discussed with the patient, family and they are in agreement.  CODE STATUS: FULL CODE  TOTAL TIME TAKING CARE OF THIS PATIENT: 35  minutes.   POSSIBLE D/C IN 1- 2  DAYS, DEPENDING ON CLINICAL CONDITION.  Note: This dictation was prepared with Dragon dictation along with smaller phrase technology. Any transcriptional errors that result from this process are unintentional.   Orie FishermanSrikar R Gaius Ishaq M.D on 01/20/2018 at 11:37 AM  Between 7am to 6pm - Pager - [phone]  After 6pm go to [email or web address] - password [email or web address] [facility]  [facility]  Office  [phone]  CC: Primary care physician; Hillery AldoPatel, Sarah, MD

## 2018-01-21 ENCOUNTER — Inpatient Hospital Stay: Payer: Medicare Other

## 2018-01-21 LAB — CBC WITH DIFFERENTIAL/PLATELET
Abs Immature Granulocytes: 0.41 10*3/uL — ABNORMAL HIGH (ref 0.00–0.07)
Basophils Absolute: 0 10*3/uL (ref 0.0–0.1)
Basophils Relative: 0 %
Eosinophils Absolute: 0.1 10*3/uL (ref 0.0–0.5)
Eosinophils Relative: 1 %
HCT: 45.1 % (ref 36.0–46.0)
Hemoglobin: 14.5 g/dL (ref 12.0–15.0)
Immature Granulocytes: 3 %
LYMPHS ABS: 0.8 10*3/uL (ref 0.7–4.0)
Lymphocytes Relative: 6 %
MCH: 32.7 pg (ref 26.0–34.0)
MCHC: 32.2 g/dL (ref 30.0–36.0)
MCV: 101.8 fL — ABNORMAL HIGH (ref 80.0–100.0)
Monocytes Absolute: 1.1 10*3/uL — ABNORMAL HIGH (ref 0.1–1.0)
Monocytes Relative: 8 %
Neutro Abs: 11.5 10*3/uL — ABNORMAL HIGH (ref 1.7–7.7)
Neutrophils Relative %: 82 %
Platelets: UNDETERMINED 10*3/uL (ref 150–400)
RBC: 4.43 MIL/uL (ref 3.87–5.11)
RDW: 15 % (ref 11.5–15.5)
WBC: 13.9 10*3/uL — ABNORMAL HIGH (ref 4.0–10.5)
nRBC: 0 % (ref 0.0–0.2)

## 2018-01-21 LAB — BASIC METABOLIC PANEL
Anion gap: 9 (ref 5–15)
BUN: 63 mg/dL — ABNORMAL HIGH (ref 8–23)
CO2: 19 mmol/L — ABNORMAL LOW (ref 22–32)
Calcium: 8.5 mg/dL — ABNORMAL LOW (ref 8.9–10.3)
Chloride: 105 mmol/L (ref 98–111)
Creatinine, Ser: 2.21 mg/dL — ABNORMAL HIGH (ref 0.44–1.00)
GFR calc Af Amer: 24 mL/min — ABNORMAL LOW (ref >60)
GFR calc non Af Amer: 21 mL/min — ABNORMAL LOW (ref >60)
Glucose, Bld: 126 mg/dL — ABNORMAL HIGH (ref 70–99)
Potassium: 5.1 mmol/L (ref 3.5–5.1)
Sodium: 133 mmol/L — ABNORMAL LOW (ref 135–145)

## 2018-01-21 LAB — GLUCOSE, CAPILLARY: Glucose-Capillary: 93 mg/dL (ref 70–99)

## 2018-01-21 MED ORDER — NYSTATIN 100000 UNIT/ML MT SUSP
5.0000 mL | Freq: Four times a day (QID) | OROMUCOSAL | Status: DC
  Administered 2018-01-21 – 2018-01-22 (×4): 500000 [IU] via OROMUCOSAL
  Filled 2018-01-21 (×7): qty 5

## 2018-01-21 NOTE — Progress Notes (Signed)
Pt BP 83/51 HR 115. O2 95 % on 2 Liters;  Nonsymptomatic; Resting well. Primary nurse paged and spoke to Dr. Sheryle Hailiamond. No new orders at this time. Primary nurse to continue to monitor.

## 2018-01-21 NOTE — Care Management Important Message (Signed)
Copy of signed IM left with patient in room.  

## 2018-01-21 NOTE — Progress Notes (Signed)
CCMD called to say patient was having runs of PVCs; checked on patient; c/o hands/fingers burning, painful; denies SOB, palpitations or skipping beats; Asymptomatic; pain medication given. Will continue to monitor. Windy Carinaurner,Shawndrea Rutkowski K, RN 11:59 PM 01/21/2018

## 2018-01-21 NOTE — Progress Notes (Signed)
[facility] -  at [facility]   PATIENT NAME: Christine Mcclain    MR#:  [id number]  DATE OF BIRTH:  79/29/1942  SUBJECTIVE:   Abdominal pain is still present. 1 episode loose stool overnight. Feels weak. Poor appetite.  REVIEW OF SYSTEMS:  CONSTITUTIONAL: No fever, fatigue or weakness.  EYES: No blurred or double vision.  EARS, NOSE, AND THROAT: No tinnitus or ear pain.  RESPIRATORY: No cough, shortness of breath, wheezing or hemoptysis.  CARDIOVASCULAR: No chest pain, orthopnea, edema.  GASTROINTESTINAL: No nausea, vomiting.  Abdominal pain present. GENITOURINARY: No dysuria, hematuria.  ENDOCRINE: No polyuria, nocturia.  HEMATOLOGY: No anemia, easy bruising or bleeding. SKIN: No rash or lesion. MUSCULOSKELETAL: No joint pain or arthritis.   NEUROLOGIC: No tingling, numbness, weakness.  PSYCHIATRY: No anxiety or depression.   DRUG ALLERGIES:   Allergies  Allergen Reactions  . Gabapentin Hives  . Naproxen Hives  . Penicillins Hives    Has patient had a PCN reaction causing immediate rash, facial/tongue/throat swelling, SOB or lightheadedness with hypotension: No Has patient had a PCN reaction causing severe rash involving mucus membranes or skin necrosis: No Has patient had a PCN reaction that required hospitalization: No Has patient had a PCN reaction occurring within the last 10 years: No If all of the above answers are "NO", then may proceed with Cephalosporin use.  . Codeine Rash    VITALS:  Blood pressure (!) 97/46, pulse (!) 110, temperature 97.8 F (36.6 C), resp. rate 20, height 5\' 3"  (1.6 m), weight 54.4 kg, SpO2 91 %.  PHYSICAL EXAMINATION:  GENERAL:  77 y.o.-year-old patient lying in the bed with no acute distress.  EYES: Pupils equal, round, reactive to light and accommodation. No scleral icterus. Extraocular muscles intact.  HEENT: Head atraumatic, normocephalic. Oropharynx and nasopharynx clear.  NECK:  Supple, no jugular venous  distention. No thyroid enlargement, no tenderness.  LUNGS: Mild wheezing CARDIOVASCULAR: S1, S2 normal. No murmurs, rubs, or gallops.  ABDOMEN: Soft, tender , nondistended. Bowel sounds present.  EXTREMITIES: No pedal edema, cyanosis, or clubbing.  NEUROLOGIC: Awake, alert and oriented x3 sensation intact. Gait not checked.  PSYCHIATRIC: The patient is alert and oriented x 3.  SKIN: No obvious rash, lesion, or ulcer.   LABORATORY PANEL:   CBC Recent Labs  Lab 01/21/18 0532  WBC 13.9*  HGB 14.5  HCT 45.1  PLT PLATELET CLUMPS NOTED ON SMEAR, UNABLE TO ESTIMATE   ------------------------------------------------------------------------------------------------------------------  Chemistries  Recent Labs  Lab 01/16/2018 0857  01/21/18 0532  NA 134*   < > 133*  K 5.2*   < > 5.1  CL 97*   < > 105  CO2 29   < > 19*  GLUCOSE 112*   < > 126*  BUN 51*   < > 63*  CREATININE 1.41*   < > 2.21*  CALCIUM 8.6*   < > 8.5*  AST 273*  --   --   ALT 79*  --   --   ALKPHOS 51  --   --   BILITOT 0.7  --   --    < > = values in this interval not displayed.   ------------------------------------------------------------------------------------------------------------------  Cardiac Enzymes Recent Labs  Lab 12/29/2017 0857  TROPONINI 0.10*   ------------------------------------------------------------------------------------------------------------------  RADIOLOGY:  No results found.  EKG:   Orders placed or performed during the hospital encounter of 01/10/2018  . EKG 12-Lead  . EKG 12-Lead  . ED EKG  . ED EKG  ASSESSMENT AND PLAN:   # Acute ascending colitis Continue  ciprofloxacin and Flagyl. Tolerating diet but poor appetite.  Abdominal pain is present.  No blood in stool. Will need to repeat CT scan of the abdomen and pelvis tomorrow if no improvement.  # Right lower lobe pneumonia On antibiotics.  Flu negative.  Nebulizers as needed. Check chest x-ray  today  #Questionable melena Hemoglobin is stable No blood in stool  # Acute kidney injury from dehydration - ATN Also received IV contrast with CT scan of the abdomen and pelvis on admission Continue IV fluids.  Monitor input and output. Discussed with Dr. Wynelle LinkKolluru of nephrology  #Elevated troponin probably demand ischemia patient  No significant increase  #Chronic systolic congestive heart failure with previous ejection fraction 30-35% Not fluid overloaded at this time.  Continue close monitoring as the patient is on IV fluids Follow-up with [facility] cardiology  Check chest x-ray today  #Right renal mass.  Will need outpatient MRI.  Discussed with patient and family at bedside. Unable to get MRI with contrast here due to acute kidney injury  #Hyperlipidemia - holding statins  #History of COPD no exacerbation Bronchodilator treatment Patient needs a CT chest eventually to rule out malignancy for ongoing tobacco abuse.  #Tobacco abuse disorder  counseled on admission  All the records are reviewed and case discussed with Care Management/Social Workerr. Management plans discussed with the patient, family and they are in agreement.  CODE STATUS: FULL CODE  TOTAL TIME TAKING CARE OF THIS PATIENT: 35  minutes.   POSSIBLE D/C IN 1- 2  DAYS, DEPENDING ON CLINICAL CONDITION.  Note: This dictation was prepared with Dragon dictation along with smaller phrase technology. Any transcriptional errors that result from this process are unintentional.   Orie FishermanSrikar R Arien Morine M.D on 01/21/2018 at 12:19 PM  Between 7am to 6pm - Pager - [phone]  After 6pm go to [email or web address] - password [email or web address] [facility]  [facility]  Office  [phone]  CC: Primary care physician; Hillery AldoPatel, Sarah, MD

## 2018-01-21 NOTE — Consult Note (Signed)
[facility]  CONSULT NOTE    Date: 01/21/2018                  Patient Name:  Christine Mcclain  MRN: [id number]  DOB: 01/27/1940  Age / Sex: 77 y.o., female         PCP: Hillery AldoPatel, Sarah, MD                 Service Requesting Consult: Dr. Elpidio AnisSudini                 Reason for Consult: Acute renal failure            History of Present Illness: Christine Mcclain is a 77 y.o. white female with who has colitis, pneumonia and right renal mass on admission.   Daughter at bedside.   Patient with nausea, abdominal pain, diarrhea and poor appetite.   Medications: Outpatient medications: Medications Prior to Admission  Medication Sig Dispense Refill Last Dose  . albuterol (PROVENTIL) (2.5 MG/3ML) 0.083% nebulizer solution Take 3 mLs by nebulization every 4 (four) hours as needed for wheezing or shortness of breath.    prn at prn  . alendronate (FOSAMAX) 70 MG tablet Take 70 mg by mouth every Saturday.    Past Week at Unknown time  . ammonium lactate (AMLACTIN) 12 % cream Apply topically 2 (two) times daily as needed for dry skin.   prn at prn  . aspirin EC 81 MG tablet Take 81 mg by mouth daily.   01/16/2018 at 0700  . atenolol (TENORMIN) 50 MG tablet Take 50 mg by mouth daily.   01/16/2018 at 0800  . atorvastatin (LIPITOR) 80 MG tablet Take 80 mg by mouth at bedtime.   01/16/2018 at 2200  . budesonide-formoterol (SYMBICORT) 160-4.5 MCG/ACT inhaler Inhale 2 puffs into the lungs 2 (two) times daily.   01/16/2018 at 2200  . cholecalciferol (VITAMIN D) 1000 units tablet Take 2,000 Units by mouth daily.   01/16/2018 at Unknown time  . ferrous sulfate 325 (65 FE) MG tablet Take 325 mg by mouth daily with breakfast.   01/16/2018 at 0800  . fluticasone (FLONASE) 50 MCG/ACT nasal spray Place 2 sprays into both nostrils daily.   prn at prn  . furosemide (LASIX) 20 MG tablet Take 20 mg by mouth daily.   01/16/2018 at 0800  . lisinopril (PRINIVIL,ZESTRIL) 5 MG tablet Take 5 mg by mouth  daily.   01/16/2018 at 0800  . naproxen sodium (ALEVE) 220 MG tablet Take 440 mg by mouth 2 (two) times daily as needed (pain).   prn at prn  . omeprazole (PRILOSEC) 20 MG capsule Take 20 mg by mouth daily.   01/16/2018 at 0700  . Potassium 99 MG TABS Take 1 tablet by mouth daily.   01/16/2018 at 0800  . ranitidine (ZANTAC) 150 MG tablet Take 150 mg by mouth daily.   01/16/2018 at Unknown time  . tiotropium (SPIRIVA) 18 MCG inhalation capsule Place 1 capsule into inhaler and inhale daily.   01/16/2018 at 0800  . nystatin (MYCOSTATIN) 100000 UNIT/ML suspension Take 5 mLs by mouth 4 (four) times daily.   08/14/2017 at am    Current medications: Current Facility-Administered Medications  Medication Dose Route Frequency Provider Last Rate Last Dose  . 0.9 %  sodium chloride infusion   Intravenous Continuous Milagros LollSudini, Srikar, MD 75 mL/hr at 01/21/18 1040    . acetaminophen (TYLENOL) tablet 650 mg  650 mg Oral Q6H PRN Luberta MutterKonidena,  Snehalatha, MD   650 mg at 01/20/18 1403   Or  . acetaminophen (TYLENOL) suppository 650 mg  650 mg Rectal Q6H PRN Katha Hamming, MD      . bisacodyl (DULCOLAX) EC tablet 5 mg  5 mg Oral Daily PRN Katha Hamming, MD      . ciprofloxacin (CIPRO) tablet 500 mg  500 mg Oral Q supper Milagros Loll, MD   500 mg at 01/20/18 1628  . docusate sodium (COLACE) capsule 100 mg  100 mg Oral BID Katha Hamming, MD   100 mg at 01/20/18 2305  . famotidine (PEPCID) tablet 10 mg  10 mg Oral Daily Gouru, Aruna, MD   10 mg at 01/20/18 1001  . feeding supplement (ENSURE ENLIVE) (ENSURE ENLIVE) liquid 237 mL  237 mL Oral BID BM Gouru, Aruna, MD   237 mL at 01/20/18 0953  . ferrous sulfate tablet 325 mg  325 mg Oral Q breakfast Gouru, Aruna, MD   325 mg at 01/20/18 1001  . HYDROcodone-acetaminophen (NORCO/VICODIN) 5-325 MG per tablet 1-2 tablet  1-2 tablet Oral Q4H PRN Katha Hamming, MD      . metroNIDAZOLE (FLAGYL) tablet 500 mg  500 mg Oral Q8H Gouru, Aruna, MD   500 mg at  01/21/18 0458  . mometasone-formoterol (DULERA) 200-5 MCG/ACT inhaler 2 puff  2 puff Inhalation BID Ramonita Lab, MD   2 puff at 01/21/18 0951  . multivitamin with minerals tablet 1 tablet  1 tablet Oral Daily Gouru, Aruna, MD   1 tablet at 01/20/18 0956  . ondansetron (ZOFRAN) tablet 4 mg  4 mg Oral Q6H PRN Katha Hamming, MD       Or  . ondansetron (ZOFRAN) injection 4 mg  4 mg Intravenous Q6H PRN Katha Hamming, MD   4 mg at 01/20/18 1628  . pantoprazole (PROTONIX) EC tablet 40 mg  40 mg Oral QAC breakfast Milagros Loll, MD   40 mg at 01/20/18 1001  . phenol (CHLORASEPTIC) mouth spray 1 spray  1 spray Mouth/Throat PRN Gouru, Aruna, MD   1 spray at 01/18/18 1302  . tiotropium (SPIRIVA) inhalation capsule ([facility] use ONLY) 18 mcg  18 mcg Inhalation Daily Gouru, Aruna, MD   18 mcg at 01/21/18 0951  . traZODone (DESYREL) tablet 25 mg  25 mg Oral QHS PRN Katha Hamming, MD      . vitamin C (ASCORBIC ACID) tablet 250 mg  250 mg Oral BID Gouru, Aruna, MD   250 mg at 01/20/18 2305      Allergies: Allergies  Allergen Reactions  . Gabapentin Hives  . Naproxen Hives  . Penicillins Hives    Has patient had a PCN reaction causing immediate rash, facial/tongue/throat swelling, SOB or lightheadedness with hypotension: No Has patient had a PCN reaction causing severe rash involving mucus membranes or skin necrosis: No Has patient had a PCN reaction that required hospitalization: No Has patient had a PCN reaction occurring within the last 10 years: No If all of the above answers are "NO", then may proceed with Cephalosporin use.  . Codeine Rash      Past Medical History: Past Medical History:  Diagnosis Date  . Arthritis   . Asthma   . COPD (chronic obstructive pulmonary disease) (HCC)   . Hyperlipidemia   . Hypertension      Past Surgical History: Past Surgical History:  Procedure Laterality Date  . ABDOMINAL HYSTERECTOMY    . APPENDECTOMY    . BACK SURGERY    .  CHOLECYSTECTOMY    .  NECK SURGERY       Family History: Family History  Problem Relation Age of Onset  . Diabetes Mother   . Hyperlipidemia Father   . Hypertension Father   . Heart disease Father   . Emphysema Sister      Social History: Social History   Socioeconomic History  . Marital status: Married    Spouse name: Not on file  . Number of children: Not on file  . Years of education: Not on file  . Highest education level: Not on file  Occupational History  . Not on file  Social Needs  . Financial resource strain: Not on file  . Food insecurity:    Worry: Not on file    Inability: Not on file  . Transportation needs:    Medical: Not on file    Non-medical: Not on file  Tobacco Use  . Smoking status: Current Every Day Smoker    Packs/day: 1.50    Years: 62.00    Pack years: 93.00    Types: Cigarettes  . Smokeless tobacco: Never Used  Substance and Sexual Activity  . Alcohol use: No  . Drug use: No  . Sexual activity: Not on file  Lifestyle  . Physical activity:    Days per week: Not on file    Minutes per session: Not on file  . Stress: Not on file  Relationships  . Social connections:    Talks on phone: Not on file    Gets together: Not on file    Attends religious service: Not on file    Active member of club or organization: Not on file    Attends meetings of clubs or organizations: Not on file    Relationship status: Not on file  . Intimate partner violence:    Fear of current or ex partner: Not on file    Emotionally abused: Not on file    Physically abused: Not on file    Forced sexual activity: Not on file  Other Topics Concern  . Not on file  Social History Narrative  . Not on file     Review of Systems: Review of Systems  Constitutional: Positive for malaise/fatigue and weight loss. Negative for chills, diaphoresis and fever.  HENT: Negative.  Negative for congestion, ear discharge, ear pain, hearing loss, nosebleeds, sinus pain,  sore throat and tinnitus.   Eyes: Negative.  Negative for blurred vision, double vision, photophobia, pain, discharge and redness.  Respiratory: Negative.  Negative for cough, hemoptysis, sputum production, shortness of breath, wheezing and stridor.   Cardiovascular: Negative.  Negative for chest pain, palpitations, orthopnea, claudication, leg swelling and PND.  Gastrointestinal: Positive for abdominal pain, diarrhea, heartburn, nausea and vomiting. Negative for blood in stool, constipation and melena.  Genitourinary: Negative.  Negative for dysuria, flank pain, frequency, hematuria and urgency.  Musculoskeletal: Negative.  Negative for back pain, falls, joint pain, myalgias and neck pain.  Skin: Negative.  Negative for itching and rash.  Neurological: Negative.  Negative for dizziness, tingling, tremors, sensory change, speech change, focal weakness, seizures, loss of consciousness, weakness and headaches.  Endo/Heme/Allergies: Negative.  Negative for environmental allergies and polydipsia. Does not bruise/bleed easily.  Psychiatric/Behavioral: Negative.  Negative for depression, hallucinations, memory loss, substance abuse and suicidal ideas. The patient is not nervous/anxious and does not have insomnia.     Vital Signs: Blood pressure (!) 83/51, pulse (!) 115, temperature 97.8 F (36.6 C), resp. rate 20, height 5\' 3"  (1.6 m), weight 54.4  kg, SpO2 95 %.  Weight trends: Filed Weights   01/18/18 0500 01/19/18 0500 01/20/18 0500  Weight: 52 kg 56.2 kg 54.4 kg    Physical Exam: General: NAD,   Head: +thrush  Eyes: Anicteric, PERRL  Neck: Supple, trachea midline  Lungs:  Clear to auscultation  Heart: Regular rate and rhythm  Abdomen:  Soft, nontender,   Extremities: no peripheral edema.  Neurologic: Nonfocal, moving all four extremities  Skin: No lesions         Lab results: Basic Metabolic Panel: Recent Labs  Lab 01/19/18 0506 01/20/18 0409 01/21/18 0532  NA 134* 135 133*   K 5.2* 5.3* 5.1  CL 106 108 105  CO2 23 22 19*  GLUCOSE 119* 126* 126*  BUN 48* 55* 63*  CREATININE 1.41* 1.60* 2.21*  CALCIUM 8.1* 8.2* 8.5*    Liver Function Tests: Recent Labs  Lab 2017-07-31 0857  AST 273*  ALT 79*  ALKPHOS 51  BILITOT 0.7  PROT 6.0*  ALBUMIN 2.7*   No results for input(s): LIPASE, AMYLASE in the last 168 hours. No results for input(s): AMMONIA in the last 168 hours.  CBC: Recent Labs  Lab 2017-07-31 0857 01/18/18 0438 01/19/18 0506 01/21/18 0532  WBC 12.0* 12.0* 9.6 13.9*  NEUTROABS 9.8*  --   --  11.5*  HGB 13.8 12.6 13.5 14.5  HCT 41.5 38.6 41.0 45.1  MCV 100.0 100.5* 100.5* 101.8*  PLT PLATELET CLUMPS NOTED ON SMEAR, UNABLE TO ESTIMATE PLATELET CLUMPS NOTED ON SMEAR, UNABLE TO ESTIMATE PLATELET CLUMPS NOTED ON SMEAR, UNABLE TO ESTIMATE PLATELET CLUMPS NOTED ON SMEAR, UNABLE TO ESTIMATE    Cardiac Enzymes: Recent Labs  Lab 2017-07-31 0857  TROPONINI 0.10*    BNP: Invalid input(s): POCBNP  CBG: Recent Labs  Lab 01/18/18 0756 01/19/18 0803 01/20/18 0749 01/21/18 0730  GLUCAP 86 106* 101* 93    Microbiology: Results for orders placed or performed during the hospital encounter of 2017-07-31  Respiratory Panel by PCR     Status: None   Collection Time: 01/18/18 10:28 AM  Result Value Ref Range Status   Adenovirus NOT DETECTED NOT DETECTED Final   Coronavirus 229E NOT DETECTED NOT DETECTED Final   Coronavirus HKU1 NOT DETECTED NOT DETECTED Final   Coronavirus NL63 NOT DETECTED NOT DETECTED Final   Coronavirus OC43 NOT DETECTED NOT DETECTED Final   Metapneumovirus NOT DETECTED NOT DETECTED Final   Rhinovirus / Enterovirus NOT DETECTED NOT DETECTED Final   Influenza A NOT DETECTED NOT DETECTED Final   Influenza B NOT DETECTED NOT DETECTED Final   Parainfluenza Virus 1 NOT DETECTED NOT DETECTED Final   Parainfluenza Virus 2 NOT DETECTED NOT DETECTED Final   Parainfluenza Virus 3 NOT DETECTED NOT DETECTED Final   Parainfluenza Virus 4  NOT DETECTED NOT DETECTED Final   Respiratory Syncytial Virus NOT DETECTED NOT DETECTED Final   Bordetella pertussis NOT DETECTED NOT DETECTED Final   Chlamydophila pneumoniae NOT DETECTED NOT DETECTED Final   Mycoplasma pneumoniae NOT DETECTED NOT DETECTED Final    Comment: Performed at [facility], [address] [place], [place] [address]    Coagulation Studies: No results for input(s): LABPROT, INR in the last 72 hours.  Urinalysis: No results for input(s): COLORURINE, LABSPEC, PHURINE, GLUCOSEU, HGBUR, BILIRUBINUR, KETONESUR, PROTEINUR, UROBILINOGEN, NITRITE, LEUKOCYTESUR in the last 72 hours.  Invalid input(s): APPERANCEUR    Imaging:  No results found.   Assessment & Plan: Ms. Christine Chardeggy T Coaxum is a 77 y.o. white female with  COPD, hypertension, hyperlipidemia, osteoarthritis, osteopenia, systolic congestive heart failure EF 30% who was admitted to [facility] on 01/21/2018 for Dehydration [E86.0] Colitis [K52.9] Chronic obstructive pulmonary disease, unspecified COPD type (HCC) [J44.9] Community acquired pneumonia of right lower lobe of lung (HCC) [J18.1]  1. Acute kidney failure on chronic kidney disease stage III: baseline creatinine of 1.18, GFR of 43 on 08/15/17.  2. Hyponatremia 3. Hyperkalemia 4. Metabolic acidosis 5. Hematuria 6. Renal Mass right - incidental finding.   Plan:  Chronic kidney disease secondary to hypertension Acute renal failure secondary to IV contrast exposure - IV fluids - Encourage PO intake.  - holding furosemide - holding naproxen.  - Needs outpatient follow up on renal mass.   LOS: 4 Krystol Rocco 12/30/201911:06 AM

## 2018-01-22 ENCOUNTER — Inpatient Hospital Stay: Payer: Medicare Other

## 2018-01-22 ENCOUNTER — Inpatient Hospital Stay: Payer: Self-pay

## 2018-01-22 DIAGNOSIS — J9601 Acute respiratory failure with hypoxia: Secondary | ICD-10-CM

## 2018-01-22 DIAGNOSIS — A419 Sepsis, unspecified organism: Secondary | ICD-10-CM

## 2018-01-22 LAB — BASIC METABOLIC PANEL
ANION GAP: 6 (ref 5–15)
Anion gap: 8 (ref 5–15)
BUN: 72 mg/dL — ABNORMAL HIGH (ref 8–23)
BUN: 70 mg/dL — ABNORMAL HIGH (ref 8–23)
CO2: 18 mmol/L — ABNORMAL LOW (ref 22–32)
CO2: 18 mmol/L — ABNORMAL LOW (ref 22–32)
Calcium: 8.8 mg/dL — ABNORMAL LOW (ref 8.9–10.3)
Calcium: 8.4 mg/dL — ABNORMAL LOW (ref 8.9–10.3)
Chloride: 106 mmol/L (ref 98–111)
Chloride: 108 mmol/L (ref 98–111)
Creatinine, Ser: 2.83 mg/dL — ABNORMAL HIGH (ref 0.44–1.00)
Creatinine, Ser: 2.82 mg/dL — ABNORMAL HIGH (ref 0.44–1.00)
GFR calc Af Amer: 18 mL/min — ABNORMAL LOW (ref >60)
GFR calc Af Amer: 18 mL/min — ABNORMAL LOW (ref >60)
GFR calc non Af Amer: 15 mL/min — ABNORMAL LOW (ref >60)
GFR calc non Af Amer: 15 mL/min — ABNORMAL LOW (ref >60)
Glucose, Bld: 117 mg/dL — ABNORMAL HIGH (ref 70–99)
Glucose, Bld: 107 mg/dL — ABNORMAL HIGH (ref 70–99)
Potassium: 5.1 mmol/L (ref 3.5–5.1)
Potassium: 5.1 mmol/L (ref 3.5–5.1)
Sodium: 132 mmol/L — ABNORMAL LOW (ref 135–145)
Sodium: 132 mmol/L — ABNORMAL LOW (ref 135–145)

## 2018-01-22 LAB — CBC WITH DIFFERENTIAL/PLATELET
Abs Immature Granulocytes: 0.41 10*3/uL — ABNORMAL HIGH (ref 0.00–0.07)
Abs Immature Granulocytes: 0.36 10*3/uL — ABNORMAL HIGH (ref 0.00–0.07)
Basophils Absolute: 0 10*3/uL (ref 0.0–0.1)
Basophils Absolute: 0 10*3/uL (ref 0.0–0.1)
Basophils Relative: 0 %
Basophils Relative: 0 %
Eosinophils Absolute: 0 10*3/uL (ref 0.0–0.5)
Eosinophils Absolute: 0 10*3/uL (ref 0.0–0.5)
Eosinophils Relative: 0 %
Eosinophils Relative: 0 %
HCT: 39.5 % (ref 36.0–46.0)
HCT: 42 % (ref 36.0–46.0)
Hemoglobin: 12.8 g/dL (ref 12.0–15.0)
Hemoglobin: 13.6 g/dL (ref 12.0–15.0)
IMMATURE GRANULOCYTES: 3 %
IMMATURE GRANULOCYTES: 3 %
LYMPHS ABS: 0.7 10*3/uL (ref 0.7–4.0)
Lymphocytes Relative: 5 %
Lymphocytes Relative: 5 %
Lymphs Abs: 0.7 10*3/uL (ref 0.7–4.0)
MCH: 33 pg (ref 26.0–34.0)
MCH: 32.9 pg (ref 26.0–34.0)
MCHC: 32.4 g/dL (ref 30.0–36.0)
MCHC: 32.4 g/dL (ref 30.0–36.0)
MCV: 101.8 fL — ABNORMAL HIGH (ref 80.0–100.0)
MCV: 101.7 fL — AB (ref 80.0–100.0)
Monocytes Absolute: 1.3 10*3/uL — ABNORMAL HIGH (ref 0.1–1.0)
Monocytes Absolute: 1.1 10*3/uL — ABNORMAL HIGH (ref 0.1–1.0)
Monocytes Relative: 9 %
Monocytes Relative: 8 %
Neutro Abs: 12.9 10*3/uL — ABNORMAL HIGH (ref 1.7–7.7)
Neutro Abs: 12.3 10*3/uL — ABNORMAL HIGH (ref 1.7–7.7)
Neutrophils Relative %: 83 %
Neutrophils Relative %: 84 %
Platelets: 130 10*3/uL — ABNORMAL LOW (ref 150–400)
Platelets: 119 10*3/uL — ABNORMAL LOW (ref 150–400)
RBC: 3.88 MIL/uL (ref 3.87–5.11)
RBC: 4.13 MIL/uL (ref 3.87–5.11)
RDW: 15.1 % (ref 11.5–15.5)
RDW: 15.2 % (ref 11.5–15.5)
WBC: 15.4 10*3/uL — ABNORMAL HIGH (ref 4.0–10.5)
WBC: 14.5 10*3/uL — AB (ref 4.0–10.5)
nRBC: 0 % (ref 0.0–0.2)
nRBC: 0 % (ref 0.0–0.2)

## 2018-01-22 LAB — BLOOD GAS, VENOUS
PCO2 VEN: 62 mmHg — AB (ref 44.0–60.0)
PH VEN: 7.3 (ref 7.250–7.430)
Patient temperature: 37

## 2018-01-22 LAB — HEPATIC FUNCTION PANEL
ALBUMIN: 1.9 g/dL — AB (ref 3.5–5.0)
ALT: 66 U/L — ABNORMAL HIGH (ref 0–44)
AST: 205 U/L — ABNORMAL HIGH (ref 15–41)
Alkaline Phosphatase: 41 U/L (ref 38–126)
Bilirubin, Direct: <0.1 mg/dL (ref 0.0–0.2)
Total Bilirubin: 0.8 mg/dL (ref 0.3–1.2)
Total Protein: 4.4 g/dL — ABNORMAL LOW (ref 6.5–8.1)

## 2018-01-22 LAB — GLUCOSE, CAPILLARY
Glucose-Capillary: 99 mg/dL (ref 70–99)
Glucose-Capillary: 105 mg/dL — ABNORMAL HIGH (ref 70–99)
Glucose-Capillary: 91 mg/dL (ref 70–99)

## 2018-01-22 LAB — LACTIC ACID, PLASMA
LACTIC ACID, VENOUS: 1.4 mmol/L (ref 0.5–1.9)
Lactic Acid, Venous: 1.2 mmol/L (ref 0.5–1.9)

## 2018-01-22 LAB — MAGNESIUM: Magnesium: 2.1 mg/dL (ref 1.7–2.4)

## 2018-01-22 LAB — PROTIME-INR
INR: 1.46
Prothrombin Time: 17.6 seconds — ABNORMAL HIGH (ref 11.4–15.2)

## 2018-01-22 LAB — MRSA PCR SCREENING: MRSA by PCR: NEGATIVE

## 2018-01-22 LAB — TROPONIN I: Troponin I: 0.12 ng/mL (ref <0.03)

## 2018-01-22 LAB — PHOSPHORUS: Phosphorus: 6.4 mg/dL — ABNORMAL HIGH (ref 2.5–4.6)

## 2018-01-22 MED ORDER — HYDROCORTISONE NA SUCCINATE PF 100 MG IJ SOLR
50.0000 mg | Freq: Four times a day (QID) | INTRAMUSCULAR | Status: DC
  Administered 2018-01-22 – 2018-01-23 (×4): 50 mg via INTRAVENOUS
  Filled 2018-01-22 (×4): qty 2

## 2018-01-22 MED ORDER — CALCIUM GLUCONATE-NACL 1-0.675 GM/50ML-% IV SOLN
1.0000 g | Freq: Once | INTRAVENOUS | Status: AC
  Administered 2018-01-22: 1000 mg via INTRAVENOUS
  Filled 2018-01-22: qty 50

## 2018-01-22 MED ORDER — FAMOTIDINE IN NACL 20-0.9 MG/50ML-% IV SOLN
20.0000 mg | Freq: Every day | INTRAVENOUS | Status: DC
  Administered 2018-01-22 – 2018-01-24 (×3): 20 mg via INTRAVENOUS
  Filled 2018-01-22 (×3): qty 50

## 2018-01-22 MED ORDER — IPRATROPIUM-ALBUTEROL 0.5-2.5 (3) MG/3ML IN SOLN: 3.0000 mL | RESPIRATORY_TRACT | Status: DC | PRN

## 2018-01-22 MED ORDER — SODIUM CHLORIDE 0.9% FLUSH
10.0000 mL | Freq: Two times a day (BID) | INTRAVENOUS | Status: DC
  Administered 2018-01-22: 10 mL
  Administered 2018-01-23: 30 mL
  Administered 2018-01-24 – 2018-01-29 (×10): 10 mL

## 2018-01-22 MED ORDER — SODIUM CHLORIDE 0.9 % IV BOLUS
1000.0000 mL | Freq: Once | INTRAVENOUS | Status: AC
  Administered 2018-01-22: 1000 mL via INTRAVENOUS

## 2018-01-22 MED ORDER — LIDOCAINE VISCOUS HCL 2 % MT SOLN
5.0000 mL | Freq: Three times a day (TID) | OROMUCOSAL | Status: DC
  Administered 2018-01-22 (×2): 5 mL via OROMUCOSAL
  Filled 2018-01-22 (×3): qty 15

## 2018-01-22 MED ORDER — DILTIAZEM HCL 25 MG/5ML IV SOLN
5.0000 mg | Freq: Once | INTRAVENOUS | Status: AC
  Administered 2018-01-22: 5 mg via INTRAVENOUS
  Filled 2018-01-22: qty 5

## 2018-01-22 MED ORDER — VASOPRESSIN 20 UNIT/ML IV SOLN
0.0300 [IU]/min | INTRAVENOUS | Status: DC
  Filled 2018-01-22: qty 2

## 2018-01-22 MED ORDER — SODIUM CHLORIDE 0.9 % IV SOLN
0.0000 ug/min | INTRAVENOUS | Status: DC
  Administered 2018-01-22: 20 ug/min via INTRAVENOUS
  Filled 2018-01-22: qty 4
  Filled 2018-01-22: qty 40
  Filled 2018-01-22 (×2): qty 4

## 2018-01-22 MED ORDER — SODIUM CHLORIDE 0.9% FLUSH: 10.0000 mL | INTRAVENOUS | Status: DC | PRN

## 2018-01-22 MED ORDER — BUDESONIDE 0.5 MG/2ML IN SUSP
0.5000 mg | Freq: Two times a day (BID) | RESPIRATORY_TRACT | Status: DC
  Administered 2018-01-22 – 2018-01-29 (×14): 0.5 mg via RESPIRATORY_TRACT
  Filled 2018-01-22 (×14): qty 2

## 2018-01-22 MED ORDER — ORAL CARE MOUTH RINSE
15.0000 mL | Freq: Two times a day (BID) | OROMUCOSAL | Status: DC
  Administered 2018-01-22 – 2018-01-26 (×7): 15 mL via OROMUCOSAL

## 2018-01-22 MED ORDER — IPRATROPIUM-ALBUTEROL 0.5-2.5 (3) MG/3ML IN SOLN
3.0000 mL | Freq: Four times a day (QID) | RESPIRATORY_TRACT | Status: DC
  Administered 2018-01-22 – 2018-01-27 (×20): 3 mL via RESPIRATORY_TRACT
  Filled 2018-01-22 (×20): qty 3

## 2018-01-22 MED ORDER — MAGIC MOUTHWASH
5.0000 mL | Freq: Three times a day (TID) | ORAL | Status: DC
  Administered 2018-01-22 (×2): 5 mL via ORAL
  Filled 2018-01-22 (×6): qty 10

## 2018-01-22 MED ORDER — MAGIC MOUTHWASH W/LIDOCAINE
5.0000 mL | Freq: Three times a day (TID) | ORAL | Status: DC
  Filled 2018-01-22 (×2): qty 5

## 2018-01-22 MED ORDER — SODIUM CHLORIDE 0.9 % IV SOLN
500.0000 mg | Freq: Two times a day (BID) | INTRAVENOUS | Status: DC
  Administered 2018-01-22 – 2018-01-29 (×15): 500 mg via INTRAVENOUS
  Filled 2018-01-22: qty 0.5
  Filled 2018-01-22 (×6): qty 500
  Filled 2018-01-22: qty 0.5
  Filled 2018-01-22 (×4): qty 500
  Filled 2018-01-22 (×2): qty 0.5
  Filled 2018-01-22: qty 500
  Filled 2018-01-22: qty 0.5

## 2018-01-22 NOTE — Progress Notes (Signed)
[facility] [place] at [facility]   PATIENT NAME: Christine Mcclain    MR#:  [id number]  DATE OF BIRTH:  03/07/1940  SUBJECTIVE:   Abdominal pain is same. More SOB and keeps saying"i dont feel well." Blood pressure low all night inspite of bolus.  No IV access.  Reviewed blood pressures from office and baseline SBP >100  REVIEW OF SYSTEMS:  CONSTITUTIONAL: Fatigue EYES: No blurred or double vision.  EARS, NOSE, AND THROAT: No tinnitus or ear pain.  RESPIRATORY: SOB CARDIOVASCULAR: No chest pain, orthopnea, edema.  GASTROINTESTINAL: Nausea and abd pain GENITOURINARY: No dysuria, hematuria.  ENDOCRINE: No polyuria, nocturia.  HEMATOLOGY: No anemia, easy bruising or bleeding. SKIN: No rash or lesion. MUSCULOSKELETAL: No joint pain or arthritis.   NEUROLOGIC: No tingling, numbness, weakness.  PSYCHIATRY: No anxiety or depression.   DRUG ALLERGIES:   Allergies  Allergen Reactions  . Gabapentin Hives  . Naproxen Hives  . Penicillins Hives    Has patient had a PCN reaction causing immediate rash, facial/tongue/throat swelling, SOB or lightheadedness with hypotension: No Has patient had a PCN reaction causing severe rash involving mucus membranes or skin necrosis: No Has patient had a PCN reaction that required hospitalization: No Has patient had a PCN reaction occurring within the last 10 years: No If all of the above answers are "NO", then may proceed with Cephalosporin use.  . Codeine Rash    VITALS:  Blood pressure (!) 110/98, pulse 91, temperature (!) 97.4 F (36.3 C), temperature source Oral, resp. rate 20, height 5\' 3"  (1.6 m), weight 60 kg, SpO2 93 %.  PHYSICAL EXAMINATION:  GENERAL:  77 y.o.-year-old patient lying in the bed with no acute distress.  EYES: Pupils equal, round, reactive to light and accommodation. No scleral icterus. Extraocular muscles intact.  HEENT: Head atraumatic, normocephalic. Oropharynx and nasopharynx clear.  NECK:   Supple, no jugular venous distention. No thyroid enlargement, no tenderness.  LUNGS: Decreased air entry bases CARDIOVASCULAR: S1, S2 normal. No murmurs, rubs, or gallops.  ABDOMEN: Soft, tender , nondistended. Bowel sounds present.  EXTREMITIES: No pedal edema, cyanosis, or clubbing.  NEUROLOGIC: Awake, alert and oriented x3 sensation intact. Gait not checked.  PSYCHIATRIC: The patient is alert and oriented x 3. Anxious SKIN: No obvious rash, lesion, or ulcer.   LABORATORY PANEL:   CBC Recent Labs  Lab 01/21/18 0532  WBC 13.9*  HGB 14.5  HCT 45.1  PLT PLATELET CLUMPS NOTED ON SMEAR, UNABLE TO ESTIMATE   ------------------------------------------------------------------------------------------------------------------  Chemistries  Recent Labs  Lab 01/11/2018 0857  01/22/18 0539  NA 134*   < > 132*  K 5.2*   < > 5.1  CL 97*   < > 106  CO2 29   < > 18*  GLUCOSE 112*   < > 117*  BUN 51*   < > 72*  CREATININE 1.41*   < > 2.83*  CALCIUM 8.6*   < > 8.8*  AST 273*  --   --   ALT 79*  --   --   ALKPHOS 51  --   --   BILITOT 0.7  --   --    < > = values in this interval not displayed.   ------------------------------------------------------------------------------------------------------------------  Cardiac Enzymes Recent Labs  Lab 01/22/18 0706  TROPONINI 0.12*   ------------------------------------------------------------------------------------------------------------------  RADIOLOGY:  Dg Chest 2 View  Result Date: 01/21/2018 CLINICAL DATA:  Hypoxia. EXAM: CHEST - 2 VIEW COMPARISON:  Chest x-ray 8 12/29/2017.  CT 01/20/2018,  09/05/2017. FINDINGS: Mediastinum and hilar structures are normal. Heart size stable. Left lower lobe atelectasis/consolidation with left-sided pleural effusion. Small right pleural effusion. Elevation left hemidiaphragm again noted. No pneumothorax contrast noted over the colon. Degenerative change and osteopenia thoracic spine. IMPRESSION: 1.  Left lower lobe atelectasis/consolidation with left-sided pleural effusion. Small right pleural effusion. These findings are new from prior exam. 2.  Elevation left hemidiaphragm again noted. Electronically Signed   By: Maisie Fushomas  Register   On: 01/21/2018 13:01   Dg Chest Port 1 View  Result Date: 01/22/2018 CLINICAL DATA:  Shortness of breath.  Current smoker.  Hypertension. EXAM: PORTABLE CHEST 1 VIEW COMPARISON:  01/21/2018. FINDINGS: LEFT lower lobe atelectasis and consolidation with LEFT pleural effusion is redemonstrated. Unchanged cardiomediastinal silhouette. Calcified tortuous aorta. Mild vascular congestion. IMPRESSION: No active disease. Electronically Signed   By: Elsie StainJohn T Curnes M.D.   On: 01/22/2018 07:18   [place] Ekg Site Rite  Result Date: 01/22/2018 If Site Rite image not attached, placement could not be confirmed due to current cardiac rhythm.   EKG:   Orders placed or performed during the hospital encounter of 01/09/2018  . EKG 12-Lead  . EKG 12-Lead  . ED EKG  . ED EKG  . EKG 12-Lead  . EKG 12-Lead  . EKG 12-Lead  . EKG 12-Lead    ASSESSMENT AND PLAN:   # Septic shock Due to colitis and pneumonia. Check stat lactic acid Discussed with Dr. Lonn Georgiaonforti. Transfer to ICU Will need pressor support IVF bolus given. Her EF is only 30% and already has pulm edema. Ordered PICC.  # Acute ascending colitis Continue  ciprofloxacin and Flagyl.  Abdominal pain is present.  No blood in stool.  # Right lower lobe pneumonia. On antibiotics.  Flu negative.  Nebulizers as needed. Repeated xray  # Acute kidney injury from ATN and IV contrast   Monitor input and output. Discussed with Dr. Wynelle LinkKolluru of nephrology Stopped IVF due to CHF at this time  #Elevated troponin probably demand ischemia patient  No significant increase  #Acute on  Chronic systolic congestive heart failure with previous ejection fraction 30-35% Transfer to ICU and will need diuresis or CRRT per  progress  #Right renal mass.  Will need outpatient MRI.  Discussed with patient and family at bedside. Unable to get MRI with contrast here due to acute kidney injury  #History of COPD no exacerbation Bronchodilator treatment Patient needs a CT chest eventually to rule out malignancy for ongoing tobacco abuse.  #Tobacco abuse disorder  counseled on admission  All the records are reviewed and case discussed with Care Management/Social Workerr. Management plans discussed with the patient, family and they are in agreement.  CODE STATUS: FULL CODE  TOTAL CRITICAL CARE TIME TAKING CARE OF THIS PATIENT: 35 minutes  POSSIBLE D/C IN 1- 2  DAYS, DEPENDING ON CLINICAL CONDITION.  Note: This dictation was prepared with Dragon dictation along with smaller phrase technology. Any transcriptional errors that result from this process are unintentional.  Orie FishermanSrikar R Mansel Strother M.D on 01/22/2018 at 8:40 AM  Between 7am to 6pm - Pager - [phone]  After 6pm go to [email or web address] - password [email or web address] [email or web address]  [facility]  Office  [phone]  CC: Primary care physician; Hillery AldoPatel, Sarah, MD

## 2018-01-22 NOTE — Progress Notes (Signed)
[facility]  ROUNDING NOTE   Subjective:   Moved to ICU for hypotension. Placed on phenylephrine gtt.   Family at bedside.   Patient complains of pain I her mouth.   Objective:  Vital signs in last 24 hours:  Temp:  [97.4 F (36.3 C)-97.7 F (36.5 C)] 97.7 F (36.5 C) (12/31 1100) Pulse Rate:  [50-135] 112 (12/31 1100) Resp:  [18-20] 19 (12/31 1100) BP: (78-110)/(45-98) 97/81 (12/31 1100) SpO2:  [91 %-99 %] 95 % (12/31 1100) Weight:  [56.3 kg-60 kg] 56.3 kg (12/31 1100)  Weight change:  Filed Weights   01/20/18 0500 01/22/18 0455 01/22/18 1100  Weight: 54.4 kg 60 kg 56.3 kg    Intake/Output: I/O last 3 completed shifts: In: 2600.4 [I.V.:2550.4; IV Piggyback:50] Out: 400 [Urine:400]   Intake/Output this shift:  No intake/output data recorded.  Physical Exam: General: NAD,   Head: Normocephalic, atraumatic. Moist oral mucosal membranes  Eyes: Anicteric, PERRL  Neck: Supple, trachea midline  Lungs:  Clear to auscultation  Heart: Regular rate and rhythm  Abdomen:  Soft, nontender,   Extremities:  no peripheral edema.  Neurologic: Nonfocal, moving all four extremities  Skin: No lesions        Basic Metabolic Panel: Recent Labs  Lab 01/18/18 0438 01/19/18 0506 01/20/18 0409 01/21/18 0532 01/22/18 0539  NA 134* 134* 135 133* 132*  K 4.7 5.2* 5.3* 5.1 5.1  CL 102 106 108 105 106  CO2 26 23 22  19* 18*  GLUCOSE 105* 119* 126* 126* 117*  BUN 50* 48* 55* 63* 72*  CREATININE 1.47* 1.41* 1.60* 2.21* 2.83*  CALCIUM 8.1* 8.1* 8.2* 8.5* 8.8*    Liver Function Tests: Recent Labs  Lab 2017-09-29 0857 01/22/18 0837  AST 273* 205*  ALT 79* 66*  ALKPHOS 51 41  BILITOT 0.7 0.8  PROT 6.0* 4.4*  ALBUMIN 2.7* 1.9*   No results for input(s): LIPASE, AMYLASE in the last 168 hours. No results for input(s): AMMONIA in the last 168 hours.  CBC: Recent Labs  Lab 2017-09-29 0857 01/18/18 0438 01/19/18 0506 01/21/18 0532 01/22/18 0837 01/22/18 1052   WBC 12.0* 12.0* 9.6 13.9* 15.4* 14.5*  NEUTROABS 9.8*  --   --  11.5* 12.9* 12.3*  HGB 13.8 12.6 13.5 14.5 12.8 13.6  HCT 41.5 38.6 41.0 45.1 39.5 42.0  MCV 100.0 100.5* 100.5* 101.8* 101.8* 101.7*  PLT PLATELET CLUMPS NOTED ON SMEAR, UNABLE TO ESTIMATE PLATELET CLUMPS NOTED ON SMEAR, UNABLE TO ESTIMATE PLATELET CLUMPS NOTED ON SMEAR, UNABLE TO ESTIMATE PLATELET CLUMPS NOTED ON SMEAR, UNABLE TO ESTIMATE 130* 119*    Cardiac Enzymes: Recent Labs  Lab 2017-09-29 0857 01/22/18 0706  TROPONINI 0.10* 0.12*    BNP: Invalid input(s): POCBNP  CBG: Recent Labs  Lab 01/20/18 0749 01/21/18 0730 01/22/18 0642 01/22/18 0735 01/22/18 1113  GLUCAP 101* 93 99 105* 91    Microbiology: Results for orders placed or performed during the hospital encounter of 2017-09-29  Respiratory Panel by PCR     Status: None   Collection Time: 01/18/18 10:28 AM  Result Value Ref Range Status   Adenovirus NOT DETECTED NOT DETECTED Final   Coronavirus 229E NOT DETECTED NOT DETECTED Final   Coronavirus HKU1 NOT DETECTED NOT DETECTED Final   Coronavirus NL63 NOT DETECTED NOT DETECTED Final   Coronavirus OC43 NOT DETECTED NOT DETECTED Final   Metapneumovirus NOT DETECTED NOT DETECTED Final   Rhinovirus / Enterovirus NOT DETECTED NOT DETECTED Final   Influenza A NOT DETECTED NOT DETECTED  Final   Influenza B NOT DETECTED NOT DETECTED Final   Parainfluenza Virus 1 NOT DETECTED NOT DETECTED Final   Parainfluenza Virus 2 NOT DETECTED NOT DETECTED Final   Parainfluenza Virus 3 NOT DETECTED NOT DETECTED Final   Parainfluenza Virus 4 NOT DETECTED NOT DETECTED Final   Respiratory Syncytial Virus NOT DETECTED NOT DETECTED Final   Bordetella pertussis NOT DETECTED NOT DETECTED Final   Chlamydophila pneumoniae NOT DETECTED NOT DETECTED Final   Mycoplasma pneumoniae NOT DETECTED NOT DETECTED Final    Comment: Performed at [facility], [address] [place], [place] [address]  MRSA PCR Screening     Status:  None   Collection Time: 01/22/18 10:52 AM  Result Value Ref Range Status   MRSA by PCR NEGATIVE NEGATIVE Final    Comment:        The GeneXpert MRSA Assay (FDA approved for NASAL specimens only), is one component of a comprehensive MRSA colonization surveillance program. It is not intended to diagnose MRSA infection nor to guide or monitor treatment for MRSA infections. Performed at [facility], [address] [place], [place] [address]     Coagulation Studies: Recent Labs    01/22/18 1052  LABPROT 17.6*  INR 1.46    Urinalysis: No results for input(s): COLORURINE, LABSPEC, PHURINE, GLUCOSEU, HGBUR, BILIRUBINUR, KETONESUR, PROTEINUR, UROBILINOGEN, NITRITE, LEUKOCYTESUR in the last 72 hours.  Invalid input(s): APPERANCEUR    Imaging: Dg Chest 2 View  Result Date: 01/21/2018 CLINICAL DATA:  Hypoxia. EXAM: CHEST - 2 VIEW COMPARISON:  Chest x-ray 8 01/14/2018.  CT 01/07/2018, 09/05/2017. FINDINGS: Mediastinum and hilar structures are normal. Heart size stable. Left lower lobe atelectasis/consolidation with left-sided pleural effusion. Small right pleural effusion. Elevation left hemidiaphragm again noted. No pneumothorax contrast noted over the colon. Degenerative change and osteopenia thoracic spine. IMPRESSION: 1. Left lower lobe atelectasis/consolidation with left-sided pleural effusion. Small right pleural effusion. These findings are new from prior exam. 2.  Elevation left hemidiaphragm again noted. Electronically Signed   By: Maisie Fushomas  Register   On: 01/21/2018 13:01   Dg Chest Port 1 View  Result Date: 01/22/2018 CLINICAL DATA:  Shortness of breath.  Current smoker.  Hypertension. EXAM: PORTABLE CHEST 1 VIEW COMPARISON:  01/21/2018. FINDINGS: LEFT lower lobe atelectasis and consolidation with LEFT pleural effusion is redemonstrated. Unchanged cardiomediastinal silhouette. Calcified tortuous aorta. Mild vascular congestion. IMPRESSION: No active disease.  Electronically Signed   By: Elsie StainJohn T Curnes M.D.   On: 01/22/2018 07:18   [place] Ekg Site Rite  Result Date: 01/22/2018 If Site Rite image not attached, placement could not be confirmed due to current cardiac rhythm.    Medications:   . famotidine (PEPCID) IV    . meropenem (MERREM) IV    . phenylephrine (NEO-SYNEPHRINE) Adult infusion 20 mcg/min (01/22/18 1048)  . vasopressin (PITRESSIN) infusion - *FOR SHOCK*     . budesonide (PULMICORT) nebulizer solution  0.5 mg Nebulization BID  . feeding supplement (ENSURE ENLIVE)  237 mL Oral BID BM  . ferrous sulfate  325 mg Oral Q breakfast  . hydrocortisone sod succinate (SOLU-CORTEF) inj  50 mg Intravenous Q6H  . ipratropium-albuterol  3 mL Nebulization Q6H  . mouth rinse  15 mL Mouth Rinse BID  . multivitamin with minerals  1 tablet Oral Daily  . nystatin  5 mL Mouth/Throat QID  . vitamin C  250 mg Oral BID   acetaminophen **OR** acetaminophen, bisacodyl, HYDROcodone-acetaminophen, ipratropium-albuterol, ondansetron **OR** ondansetron (ZOFRAN) IV, phenol, traZODone  Assessment/ Plan:  Christine Mcclain is a 77 y.o. white female with COPD, hypertension, hyperlipidemia, osteoarthritis, osteopenia, systolic congestive heart failure EF 30% who was admitted to [facility] on 01/21/2018 for colitis, pneumonia and acute renal failure  1. Acute kidney failure on chronic kidney disease stage III: baseline creatinine of 1.18, GFR of 43 on 08/15/17.  2. Hyponatremia 3. Hyperkalemia 4. Metabolic acidosis 5. Hematuria 6. Renal Mass right - incidental finding.   Plan:  Chronic kidney disease secondary to hypertension Acute renal failure secondary to IV contrast exposure, prerenal azotemia and hypotension  Off IV fluids - Encourage PO intake.  - holding furosemide - holding naproxen.  - Needs outpatient follow up on renal mass.    LOS: 5 Newell Wafer 12/31/201912:38 PM

## 2018-01-22 NOTE — Progress Notes (Signed)
Patient BP low. Patient feels dizzy and tired. Bolus started prior to night shift leaving the floor. Was not able to complete bolus. IV infiltrated. Notified Dr Elpidio AnisSudini of patient condition and loss of IV access. Upon assessing pt, MD ordered to transfer patient to ICU. Report called to Bubba Campheryl Green RN. Family in room at the time of transfer.

## 2018-01-22 NOTE — Progress Notes (Signed)
Patient complaining of SOB, 87% on 2Lnc; O2 increased to 4L [place] 91%; pulse 47 on dinamap, 96 on telemetry; Dr. Sheryle Hailiamond notified of changes, at bedside; [facility]; acknowledged, new orders written. Windy Carinaurner,Jaidan Stachnik K, RN 6:46 AM 01/22/2018

## 2018-01-22 NOTE — Progress Notes (Signed)
Nutrition Follow Up Note   DOCUMENTATION CODES:   Non-severe (moderate) malnutrition in context of chronic illness  INTERVENTION:   Ensure Enlive po BID, each supplement provides 350 kcal and 20 grams of protein  Magic cup TID with meals, each supplement provides 290 kcal and 9 grams of protein  MVI daily  Vitamin C '250mg'$  po BID  Recommend check Mg and P labs   NUTRITION DIAGNOSIS:   Moderate Malnutrition related to chronic illness(COPD) as evidenced by moderate fat depletion, moderate to severe muscle depletions.  GOAL:   Patient will meet greater than or equal to 90% of their needs  -not met   MONITOR:   PO intake, Supplement acceptance, Labs, Weight trends, Skin, I & O's  ASSESSMENT:   77 y.o. female with a known history of COPD, on chronic oxygen 2 L all the time, arthritis, essential hypertension, GERD recently started on PPIs presents with left lower quadrant abdominal pain for last 3 to 4 days associated with nausea, denies any diarrhea.   Pt with poor appetite and oral intake since admit. Pt was eating better at the start of her admission but has progressively been declining. Pt is still drinking some Ensure. Pt with hypotension today; transferred to ICU on pressors. Pt may benefit from NGT placement and nutrition support. Will address nutritional status once pt is stabilized. Per chart, pt is ~10lbs up in weight since admit. Pt likely at refeeding risk; recommend monitor K, Mg and P labs daily.   Medications reviewed and include: ferrous sulfate, solu-cortef, MVI, vitamin C, pepcid, meropenem, neo-synephrine, vasopressin   Labs reviewed: Na 132(L), K 5.1 wnl, BUN 72(H), creat 2.83(H) Wbc- 14.5(H)  Diet Order:   Diet Order            DIET SOFT Room service appropriate? Yes; Fluid consistency: Thin  Diet effective now             EDUCATION NEEDS:   Education needs have been addressed  Skin:  Skin Assessment: Reviewed RN Assessment(ecchymosis )  Last BM:   12/30  Height:   Ht Readings from Last 1 Encounters:  01/22/18 '5\' 2"'$  (1.575 m)    Weight:   Wt Readings from Last 1 Encounters:  01/22/18 56.3 kg    Ideal Body Weight:  52.3 kg  BMI:  Body mass index is 22.7 kg/m.  Estimated Nutritional Needs:   Kcal:  1300-1500kcal/day   Protein:  77-88g/day   Fluid:  >1.3L/day   Christine Distance MS, RD, LDN Pager #- [phone] Office#- [phone] After Hours Pager: [phone]

## 2018-01-22 NOTE — Consult Note (Signed)
Reason for Consult: Sepsis  referring Physician: Dr. Alphia MohSudini  Christine Mcclain is an 77 y.o. female.  HPI: Christine Mcclain is a 77 year old female with a past medical history remarkable for hypertension, hyperlipidemia, COPD, asthma, arthritis, was admitted on 12/26 after developing left lower quadrant pain associated with nausea.  She also was passing black stool.  Was admitted and treated for colitis, fluid resuscitation, antibiotics with Cipro and Flagyl.  She was also noted during hospitalization to have a right lower lobe pneumonia, possible melena, acute renal injury, elevated troponin, chronic systolic heart failure with an ejection fraction of 30%, right renal mass being followed.  This morning rapid response secondary to decreasing blood pressure and loss of IV access. Patient is also having some increasing shortness of breath and is being moved to the intensive care unit for further therapy  Past Medical History:  Diagnosis Date  . Arthritis   . Asthma   . COPD (chronic obstructive pulmonary disease) (HCC)   . Hyperlipidemia   . Hypertension     Past Surgical History:  Procedure Laterality Date  . ABDOMINAL HYSTERECTOMY    . APPENDECTOMY    . BACK SURGERY    . CHOLECYSTECTOMY    . NECK SURGERY      Family History  Problem Relation Age of Onset  . Diabetes Mother   . Hyperlipidemia Father   . Hypertension Father   . Heart disease Father   . Emphysema Sister     Social History:  reports that she has been smoking cigarettes. She has a 93.00 pack-year smoking history. She has never used smokeless tobacco. She reports that she does not drink alcohol or use drugs.  Allergies:  Allergies  Allergen Reactions  . Gabapentin Hives  . Naproxen Hives  . Penicillins Hives    Has patient had a PCN reaction causing immediate rash, facial/tongue/throat swelling, SOB or lightheadedness with hypotension: No Has patient had a PCN reaction causing severe rash involving mucus membranes or skin  necrosis: No Has patient had a PCN reaction that required hospitalization: No Has patient had a PCN reaction occurring within the last 10 years: No If all of the above answers are "NO", then may proceed with Cephalosporin use.  . Codeine Rash    Medications: I have reviewed the patient's current medications.  Results for orders placed or performed during the hospital encounter of 2018/01/03 (from the past 48 hour(s))  CBC with Differential/Platelet     Status: Abnormal   Collection Time: 01/21/18  5:32 AM  Result Value Ref Range   WBC 13.9 (H) 4.0 - 10.5 K/uL   RBC 4.43 3.87 - 5.11 MIL/uL   Hemoglobin 14.5 12.0 - 15.0 g/dL   HCT 84.145.1 32.436.0 - 40.146.0 %   MCV 101.8 (H) 80.0 - 100.0 fL   MCH 32.7 26.0 - 34.0 pg   MCHC 32.2 30.0 - 36.0 g/dL   RDW 02.715.0 25.311.5 - 66.415.5 %   Platelets PLATELET CLUMPS NOTED ON SMEAR, UNABLE TO ESTIMATE 150 - 400 K/uL    Comment: Immature Platelet Fraction may be clinically indicated, consider ordering this additional test [id number]    nRBC 0.0 0.0 - 0.2 %   Neutrophils Relative % 82 %   Neutro Abs 11.5 (H) 1.7 - 7.7 K/uL   Lymphocytes Relative 6 %   Lymphs Abs 0.8 0.7 - 4.0 K/uL   Monocytes Relative 8 %   Monocytes Absolute 1.1 (H) 0.1 - 1.0 K/uL   Eosinophils Relative 1 %  Eosinophils Absolute 0.1 0.0 - 0.5 K/uL   Basophils Relative 0 %   Basophils Absolute 0.0 0.0 - 0.1 K/uL   Immature Granulocytes 3 %   Abs Immature Granulocytes 0.41 (H) 0.00 - 0.07 K/uL    Comment: Performed at [facility], [address] [place], [place] [address]  Basic metabolic panel     Status: Abnormal   Collection Time: 01/21/18  5:32 AM  Result Value Ref Range   Sodium 133 (L) 135 - 145 mmol/L   Potassium 5.1 3.5 - 5.1 mmol/L   Chloride 105 98 - 111 mmol/L   CO2 19 (L) 22 - 32 mmol/L   Glucose, Bld 126 (H) 70 - 99 mg/dL   BUN 63 (H) 8 - 23 mg/dL   Creatinine, Ser 6.042.21 (H) 0.44 - 1.00 mg/dL   Calcium 8.5 (L) 8.9 - 10.3 mg/dL   GFR calc non Af Amer 21 (L) >60  mL/min   GFR calc Af Amer 24 (L) >60 mL/min   Anion gap 9 5 - 15    Comment: Performed at [facility], [address] [place], [place] [address]  Glucose, capillary     Status: None   Collection Time: 01/21/18  7:30 AM  Result Value Ref Range   Glucose-Capillary 93 70 - 99 mg/dL  Basic metabolic panel     Status: Abnormal   Collection Time: 01/22/18  5:39 AM  Result Value Ref Range   Sodium 132 (L) 135 - 145 mmol/L   Potassium 5.1 3.5 - 5.1 mmol/L   Chloride 106 98 - 111 mmol/L   CO2 18 (L) 22 - 32 mmol/L   Glucose, Bld 117 (H) 70 - 99 mg/dL   BUN 72 (H) 8 - 23 mg/dL   Creatinine, Ser 1.192.83 (H) 0.44 - 1.00 mg/dL   Calcium 8.8 (L) 8.9 - 10.3 mg/dL   GFR calc non Af Amer 15 (L) >60 mL/min   GFR calc Af Amer 18 (L) >60 mL/min   Anion gap 8 5 - 15    Comment: Performed at [facility], [address] [place], [place] [address]  Glucose, capillary     Status: None   Collection Time: 01/22/18  6:42 AM  Result Value Ref Range   Glucose-Capillary 99 70 - 99 mg/dL  Troponin I - ONCE - STAT     Status: Abnormal   Collection Time: 01/22/18  7:06 AM  Result Value Ref Range   Troponin I 0.12 (HH) <0.03 ng/mL    Comment: CRITICAL VALUE NOTED. VALUE IS CONSISTENT WITH PREVIOUSLY REPORTED/CALLED VALUE...[facility] Performed at [facility], [address] [place], [place] [address]   Glucose, capillary     Status: Abnormal   Collection Time: 01/22/18  7:35 AM  Result Value Ref Range   Glucose-Capillary 105 (H) 70 - 99 mg/dL    Dg Chest 2 View  Result Date: 01/21/2018 CLINICAL DATA:  Hypoxia. EXAM: CHEST - 2 VIEW COMPARISON:  Chest x-ray 8 12/23/2017.  CT 01/08/2018, 09/05/2017. FINDINGS: Mediastinum and hilar structures are normal. Heart size stable. Left lower lobe atelectasis/consolidation with left-sided pleural effusion. Small right pleural effusion. Elevation left hemidiaphragm again noted. No pneumothorax contrast noted over the colon. Degenerative change  and osteopenia thoracic spine. IMPRESSION: 1. Left lower lobe atelectasis/consolidation with left-sided pleural effusion. Small right pleural effusion. These findings are new from prior exam. 2.  Elevation left hemidiaphragm again noted. Electronically Signed   By: Maisie Fushomas  Register   On: 01/21/2018 13:01  Dg Chest Port 1 View  Result Date: 01/22/2018 CLINICAL DATA:  Shortness of breath.  Current smoker.  Hypertension. EXAM: PORTABLE CHEST 1 VIEW COMPARISON:  01/21/2018. FINDINGS: LEFT lower lobe atelectasis and consolidation with LEFT pleural effusion is redemonstrated. Unchanged cardiomediastinal silhouette. Calcified tortuous aorta. Mild vascular congestion. IMPRESSION: No active disease. Electronically Signed   By: Elsie Stain M.D.   On: 01/22/2018 07:18   [place] Ekg Site Rite  Result Date: 01/22/2018 If Site Rite image not attached, placement could not be confirmed due to current cardiac rhythm.   ROS  Patient acutely ill, unable to obtain at this time Blood pressure (!) 110/98, pulse 91, temperature (!) 97.4 F (36.3 C), temperature source Oral, resp. rate 20, height 5\' 3"  (1.6 m), weight 60 kg, SpO2 93 %. Physical Exam Vital signs: Please see the above listed vital signs Patient is awake, responsive and communicating Systolic blood pressures in the 70s.  Patient does not have any IV access at this point HEENT: , Trachea midline, no accessory muscle utilization, no oral lesions appreciated Cardiovascular: Tachycardia in the 110s Pulmonary: Clear to auscultation Abdominal: Hypoactive bowel sounds, not distended, no significant point tenderness to palpation Extremities: Clubbing, cyanosis or edema noted Neurologic: Patient moves all extremities, no clear focal deficits noted  Assessment/Plan:  Septic shock.  Will DC oral ciprofloxacin and Flagyl, will start on meropenem.  Will place central line emergently, started on Neo-Synephrine, vasopressin, stress dose corticosteroids.  Patient  has a reduced ejection fraction will be cautious with fluids  Acute renal failure.  Pending repeat BMP, BUN 72/creatinine 2.83.  Being followed by nephrology, most likely multifactorial to include hypotension, ATN, sepsis, contrast  Hyponatremia.  132 pending repeat study  Leukocytosis.  Secondary to sepsis  Heart failure with reduced ejection fraction  Melena.  No evidence of active bleeding, hemoglobin is stable  Elevated troponin at 0.12.  Will obtain 12-lead, cycle cardiac enzymes, reflect supply demand ischemia  Right renal mass.  Will need further evaluation and follow-up  Critical  care time 45 minutes  Tora Kindred, DO Heidi Lemay 01/22/2018, 8:59 AM

## 2018-01-22 NOTE — Progress Notes (Signed)
"  Secure chat" sent to Dr. Sheryle Hailiamond regarding tachycardia at rest; low urine output and no blood work ordered for this am. Repaged on [phone]. Windy Carinaurner,Miley Blanchett K, RN 5:28 AM; 01/22/2018

## 2018-01-22 NOTE — Progress Notes (Signed)
[facility]cardic [facility]; voided only 150, tea-colored urine; bladder scanned with 221 ml resulted; sustaining HR >120 on resting in bed; On-call MD paged; awaiting callback. Windy Carinaurner,Haedyn Breau K, RN 4:47 AM 01/22/2018

## 2018-01-22 NOTE — Progress Notes (Signed)
Patient ID: Christine Mcclain, female   DOB: 10/02/1940, 77 y.o.   MRN: [id number] Pulmonary/critical care attending  Procedure note Central line placement Indication: No access and hypotension Emergently performed Complete contact barrier utilization Ultrasound guidance Right femoral vein location Patient draped and prepped in standard fashion Subdermal lidocaine after topical Hibiclens Using the ultrasound probe the right femoral vein was cannulated first pass Guidewire was placed and needle was removed Using the Seldinger technique a triple-lumen catheter was placed without difficulty The guidewire was removed intact All 3 ports had dark nonpulsatile venous return and were flushed Sutured into place Dressed by nurse  Tora KindredJohn Iverson Sees, DO

## 2018-01-22 NOTE — Progress Notes (Signed)
Dr. Anne HahnWillis notified of: fingers burning, PVCs, one episode of afib with RVR; hypotension and tachycardia; Acknowledged; new order written for calcium; continue to monitor. Windy Carinaurner,Mory Herrman K, RN 12:51 AM 01/22/2018

## 2018-01-22 NOTE — Progress Notes (Signed)
PT Cancellation Note  Patient Details Name: Christine Mcclain MRN: [id number] DOB: 05/16/1940   Cancelled Treatment:    Reason Eval/Treat Not Completed: Medical issues which prohibited therapy.  Per chart review, pt has been transferred to the ICU due to concerns over low BP.  Per protocol, with no continue at transfer orders, PT will sign off at this time and will need a new PT order, if appropriate, once pt is medically stable.    Encarnacion ChuAshley Abashian PT, DPT 01/22/2018, 10:26 AM

## 2018-01-22 NOTE — Progress Notes (Signed)
Dr. Sheryle Hailiamond acknowledged via "secure chat"; new orders written. Windy Carinaurner,Rula Keniston K, RN 5:31 AM 01/22/2018

## 2018-01-22 NOTE — Progress Notes (Signed)
HR >120 sustained; patient complaining of "worse pain I've ever had in my hands...they are on fire..." Norco not effective at this time; Waiting for MD callback; repaged.Windy Carinaurner,Yolani Vo K, RN 12:37 AM 01/22/2018

## 2018-01-22 NOTE — Progress Notes (Signed)
On-call Hospitalist paged for sustained HR >120; PVCs and new afib with RVR noted from CCMD; patient asleep at this time; asymptomatic; awaiting callback. Windy Carinaurner,Khushbu Pippen K, RN 12:24 AM 01/22/2018

## 2018-01-22 NOTE — Progress Notes (Signed)
Dr. Sheryle Hailiamond notified of BP 85/55, pulse 84; acknowledged; no new orders at this time. "Let me think on it". Windy Carinaurner,Damire Remedios K, RN 7:01 AM 01/22/2018

## 2018-01-22 NOTE — Progress Notes (Signed)
Patient states that her fingers no longer burn; AP 88 on telemetry with occasional asymptomatic PVCs; Windy Carinaurner,Marabella Popiel K, RN 4:11 AM 01/22/2018

## 2018-01-23 LAB — CBC
HCT: 43.5 % (ref 36.0–46.0)
Hemoglobin: 14 g/dL (ref 12.0–15.0)
MCH: 32.9 pg (ref 26.0–34.0)
MCHC: 32.2 g/dL (ref 30.0–36.0)
MCV: 102.1 fL — ABNORMAL HIGH (ref 80.0–100.0)
Platelets: 118 10*3/uL — ABNORMAL LOW (ref 150–400)
RBC: 4.26 MIL/uL (ref 3.87–5.11)
RDW: 15.3 % (ref 11.5–15.5)
WBC: 15.6 10*3/uL — ABNORMAL HIGH (ref 4.0–10.5)
nRBC: 0 % (ref 0.0–0.2)

## 2018-01-23 LAB — BASIC METABOLIC PANEL
Anion gap: 9 (ref 5–15)
BUN: 78 mg/dL — ABNORMAL HIGH (ref 8–23)
CALCIUM: 8.7 mg/dL — AB (ref 8.9–10.3)
CO2: 16 mmol/L — ABNORMAL LOW (ref 22–32)
Chloride: 109 mmol/L (ref 98–111)
Creatinine, Ser: 3.05 mg/dL — ABNORMAL HIGH (ref 0.44–1.00)
GFR calc Af Amer: 16 mL/min — ABNORMAL LOW (ref >60)
GFR calc non Af Amer: 14 mL/min — ABNORMAL LOW (ref >60)
Glucose, Bld: 112 mg/dL — ABNORMAL HIGH (ref 70–99)
Potassium: 5.4 mmol/L — ABNORMAL HIGH (ref 3.5–5.1)
Sodium: 134 mmol/L — ABNORMAL LOW (ref 135–145)

## 2018-01-23 LAB — BASIC METABOLIC PANEL WITH GFR
Anion gap: 8 (ref 5–15)
BUN: 83 mg/dL — ABNORMAL HIGH (ref 8–23)
CO2: 16 mmol/L — ABNORMAL LOW (ref 22–32)
Calcium: 8.6 mg/dL — ABNORMAL LOW (ref 8.9–10.3)
Chloride: 110 mmol/L (ref 98–111)
Creatinine, Ser: 3.36 mg/dL — ABNORMAL HIGH (ref 0.44–1.00)
GFR calc Af Amer: 14 mL/min — ABNORMAL LOW
GFR calc non Af Amer: 12 mL/min — ABNORMAL LOW
Glucose, Bld: 139 mg/dL — ABNORMAL HIGH (ref 70–99)
Potassium: 5.3 mmol/L — ABNORMAL HIGH (ref 3.5–5.1)
Sodium: 134 mmol/L — ABNORMAL LOW (ref 135–145)

## 2018-01-23 LAB — GLUCOSE, CAPILLARY: Glucose-Capillary: 112 mg/dL — ABNORMAL HIGH (ref 70–99)

## 2018-01-23 MED ORDER — SODIUM CHLORIDE 0.9 % IV SOLN
0.0000 ug/min | INTRAVENOUS | Status: DC
  Administered 2018-01-23: 90 ug/min via INTRAVENOUS
  Administered 2018-01-23: 40 ug/min via INTRAVENOUS
  Administered 2018-01-23: 50 ug/min via INTRAVENOUS
  Administered 2018-01-24: 35 ug/min via INTRAVENOUS
  Administered 2018-01-26: 13 ug/min via INTRAVENOUS
  Administered 2018-01-27: 8 ug/min via INTRAVENOUS
  Filled 2018-01-23: qty 40
  Filled 2018-01-23 (×2): qty 4
  Filled 2018-01-23 (×2): qty 40

## 2018-01-23 MED ORDER — DEXTROSE 50 % IV SOLN
1.0000 | Freq: Once | INTRAVENOUS | Status: AC
  Administered 2018-01-23: 50 mL via INTRAVENOUS
  Filled 2018-01-23: qty 50

## 2018-01-23 MED ORDER — AMIODARONE HCL IN DEXTROSE 360-4.14 MG/200ML-% IV SOLN
60.0000 mg/h | INTRAVENOUS | Status: AC
  Administered 2018-01-23: 60 mg/h via INTRAVENOUS
  Filled 2018-01-23: qty 200

## 2018-01-23 MED ORDER — FUROSEMIDE 10 MG/ML IJ SOLN
80.0000 mg | Freq: Once | INTRAMUSCULAR | Status: AC
  Administered 2018-01-23: 80 mg via INTRAVENOUS
  Filled 2018-01-23: qty 8

## 2018-01-23 MED ORDER — LIDOCAINE VISCOUS HCL 2 % MT SOLN
5.0000 mL | Freq: Three times a day (TID) | OROMUCOSAL | Status: DC
  Administered 2018-01-23 – 2018-01-29 (×9): 5 mL via OROMUCOSAL
  Filled 2018-01-23 (×23): qty 15

## 2018-01-23 MED ORDER — INSULIN ASPART 100 UNIT/ML IV SOLN
10.0000 [IU] | Freq: Once | INTRAVENOUS | Status: AC
  Administered 2018-01-23: 10 [IU] via INTRAVENOUS
  Filled 2018-01-23: qty 0.1

## 2018-01-23 MED ORDER — ALBUMIN HUMAN 25 % IV SOLN
25.0000 g | Freq: Once | INTRAVENOUS | Status: AC
  Administered 2018-01-23: 25 g via INTRAVENOUS
  Filled 2018-01-23: qty 100

## 2018-01-23 MED ORDER — DEXMEDETOMIDINE HCL IN NACL 400 MCG/100ML IV SOLN
0.4000 ug/kg/h | INTRAVENOUS | Status: DC
  Administered 2018-01-23: 0.6 ug/kg/h via INTRAVENOUS
  Administered 2018-01-23: 0.5 ug/kg/h via INTRAVENOUS
  Administered 2018-01-23: 0.4 ug/kg/h via INTRAVENOUS
  Administered 2018-01-24 – 2018-01-25 (×2): 0.5 ug/kg/h via INTRAVENOUS
  Filled 2018-01-23 (×3): qty 100

## 2018-01-23 MED ORDER — INSULIN ASPART 100 UNIT/ML IV SOLN: 10.0000 [IU] | Freq: Once | INTRAVENOUS | Status: DC

## 2018-01-23 MED ORDER — MAGIC MOUTHWASH
5.0000 mL | Freq: Four times a day (QID) | ORAL | Status: DC
  Administered 2018-01-23 – 2018-01-29 (×13): 5 mL via ORAL
  Filled 2018-01-23 (×3): qty 10
  Filled 2018-01-23: qty 5
  Filled 2018-01-23 (×4): qty 10
  Filled 2018-01-23: qty 5
  Filled 2018-01-23 (×3): qty 10
  Filled 2018-01-23: qty 5
  Filled 2018-01-23 (×2): qty 10
  Filled 2018-01-23: qty 5
  Filled 2018-01-23 (×4): qty 10
  Filled 2018-01-23: qty 5
  Filled 2018-01-23 (×8): qty 10
  Filled 2018-01-23 (×2): qty 5
  Filled 2018-01-23: qty 10
  Filled 2018-01-23: qty 5

## 2018-01-23 MED ORDER — AMIODARONE HCL IN DEXTROSE 360-4.14 MG/200ML-% IV SOLN
INTRAVENOUS | Status: AC
  Administered 2018-01-23: 59.94 mg/h via INTRAVENOUS
  Filled 2018-01-23: qty 200

## 2018-01-23 MED ORDER — METHYLPREDNISOLONE SODIUM SUCC 125 MG IJ SOLR
60.0000 mg | Freq: Four times a day (QID) | INTRAMUSCULAR | Status: DC
  Administered 2018-01-23 – 2018-01-26 (×12): 60 mg via INTRAVENOUS
  Filled 2018-01-23 (×12): qty 2

## 2018-01-23 MED ORDER — AMIODARONE LOAD VIA INFUSION
150.0000 mg | Freq: Once | INTRAVENOUS | Status: AC
  Administered 2018-01-23: 59.94 mg/h via INTRAVENOUS
  Filled 2018-01-23: qty 83.34

## 2018-01-23 MED ORDER — AMIODARONE HCL IN DEXTROSE 360-4.14 MG/200ML-% IV SOLN
30.0000 mg/h | INTRAVENOUS | Status: DC
  Administered 2018-01-23 – 2018-01-29 (×11): 30 mg/h via INTRAVENOUS
  Filled 2018-01-23 (×12): qty 200

## 2018-01-23 MED ORDER — INSULIN ASPART 100 UNIT/ML ~~LOC~~ SOLN: 10.0000 [IU] | Freq: Once | SUBCUTANEOUS | Status: DC

## 2018-01-23 NOTE — Progress Notes (Signed)
Follow up - Critical Care Medicine Note  Patient Details:    Christine Mcclain is an 78 y.o. female.with a past medical history remarkable for hypertension, hyperlipidemia, COPD, asthma, arthritis, was admitted on 12/26 after developing left lower quadrant pain associated with nausea.  She also was passing black stool.  Was admitted and treated for colitis, fluid resuscitation, antibiotics with Cipro and Flagyl.  She was also noted during hospitalization to have a right lower lobe pneumonia, possible melena, acute renal injury, elevated troponin, chronic systolic heart failure with an ejection fraction of 30%, right renal mass being followed.  Rapid response secondary to decreasing blood pressure and loss of IV access. Patient is also having some increasing shortness of breath and is being moved to the intensive care unit for further therapy   Lines, Airways, Drains: CVC Triple Lumen 01/22/18 Right Femoral 20 cm (Active)  Indication for Insertion or Continuance of Line Vasoactive infusions 01/22/2018  8:10 PM  Site Assessment Clean;Dry;Intact 01/22/2018  8:10 PM  Proximal Lumen Status Flushed;Saline locked 01/22/2018  8:10 PM  Medial Lumen Status Infusing;Flushed 01/22/2018  8:10 PM  Distal Lumen Status Flushed;Saline locked 01/22/2018  8:10 PM  Dressing Type Transparent;Occlusive 01/22/2018  8:10 PM  Dressing Status Clean;Dry;Intact;Antimicrobial disc in place 01/22/2018  8:10 PM  Line Care Connections checked and tightened 01/22/2018  8:10 PM  Dressing Intervention New dressing 01/22/2018 11:00 AM  Dressing Change Due 01/29/18 01/22/2018  8:10 PM     Urethral Catheter C Green, RN Double-lumen;Latex 14 Fr. (Active)  Indication for Insertion or Continuance of Catheter Unstable critical patients (first 24-48 hours) 01/23/2018  5:17 AM  Site Assessment Clean;Intact 01/23/2018  5:17 AM  Date Prophylactic Dressing Applied (if applicable) 01/22/18 01/22/2018 11:00 AM  Catheter Maintenance Bag below level  of bladder 01/23/2018  5:17 AM  Collection Container Standard drainage bag 01/23/2018  5:17 AM  Securement Method Securing device (Describe) 01/23/2018  5:17 AM  Output (mL) 315 mL 01/22/2018  4:00 PM    Anti-infectives:  Anti-infectives (From admission, onward)   Start     Dose/Rate Route Frequency Ordered Stop   01/22/18 1130  meropenem (MERREM) 500 mg in sodium chloride 0.9 % 100 mL IVPB     500 mg 200 mL/hr over 30 Minutes Intravenous Every 12 hours 01/22/18 1129     01/19/18 1700  ciprofloxacin (CIPRO) tablet 500 mg  Status:  Discontinued     500 mg Oral Daily with supper 01/19/18 1331 01/22/18 1058   01/19/18 0600  metroNIDAZOLE (FLAGYL) tablet 500 mg  Status:  Discontinued     500 mg Oral Every 8 hours 01/18/18 1442 01/22/18 1058   01/18/18 2000  ciprofloxacin (CIPRO) tablet 250 mg  Status:  Discontinued     250 mg Oral 2 times daily 01/18/18 1442 01/18/18 1628   01/18/18 1800  ciprofloxacin (CIPRO) IVPB 400 mg  Status:  Discontinued     400 mg 200 mL/hr over 60 Minutes Intravenous Every 24 hours 01/18/18 1628 01/19/18 1331   01/18/18 0000  levofloxacin (LEVAQUIN) IVPB 750 mg  Status:  Discontinued     750 mg 100 mL/hr over 90 Minutes Intravenous  Once 01/22/2018 1159 01/06/2018 1159   01/04/2018 1200  ciprofloxacin (CIPRO) IVPB 400 mg  Status:  Discontinued     400 mg 200 mL/hr over 60 Minutes Intravenous Every 12 hours 01/01/2018 1158 01/18/2018 1159   01/13/2018 1200  metroNIDAZOLE (FLAGYL) IVPB 500 mg  Status:  Discontinued     500 mg 100  mL/hr over 60 Minutes Intravenous Every 8 hours 2018/01/27 1158 2018/01/27 1159   01-27-18 1200  metroNIDAZOLE (FLAGYL) IVPB 500 mg     500 mg 100 mL/hr over 60 Minutes Intravenous Every 8 hours 01-27-2018 1159 01/18/18 2318   01/27/2018 1130  levofloxacin (LEVAQUIN) IVPB 750 mg     750 mg 100 mL/hr over 90 Minutes Intravenous  Once 2018-01-27 1116 27-Jan-2018 1336      Microbiology: Results for orders placed or performed during the hospital encounter of  01-27-2018  Respiratory Panel by PCR     Status: None   Collection Time: 01/18/18 10:28 AM  Result Value Ref Range Status   Adenovirus NOT DETECTED NOT DETECTED Final   Coronavirus 229E NOT DETECTED NOT DETECTED Final   Coronavirus HKU1 NOT DETECTED NOT DETECTED Final   Coronavirus NL63 NOT DETECTED NOT DETECTED Final   Coronavirus OC43 NOT DETECTED NOT DETECTED Final   Metapneumovirus NOT DETECTED NOT DETECTED Final   Rhinovirus / Enterovirus NOT DETECTED NOT DETECTED Final   Influenza A NOT DETECTED NOT DETECTED Final   Influenza B NOT DETECTED NOT DETECTED Final   Parainfluenza Virus 1 NOT DETECTED NOT DETECTED Final   Parainfluenza Virus 2 NOT DETECTED NOT DETECTED Final   Parainfluenza Virus 3 NOT DETECTED NOT DETECTED Final   Parainfluenza Virus 4 NOT DETECTED NOT DETECTED Final   Respiratory Syncytial Virus NOT DETECTED NOT DETECTED Final   Bordetella pertussis NOT DETECTED NOT DETECTED Final   Chlamydophila pneumoniae NOT DETECTED NOT DETECTED Final   Mycoplasma pneumoniae NOT DETECTED NOT DETECTED Final    Comment: Performed at [facility], [address] [place], [place] [address]  MRSA PCR Screening     Status: None   Collection Time: 01/22/18 10:52 AM  Result Value Ref Range Status   MRSA by PCR NEGATIVE NEGATIVE Final    Comment:        The GeneXpert MRSA Assay (FDA approved for NASAL specimens only), is one component of a comprehensive MRSA colonization surveillance program. It is not intended to diagnose MRSA infection nor to guide or monitor treatment for MRSA infections. Performed at [facility], [address], [place], [place] [address]    Studies: Dg Chest 2 View  Result Date: 01/21/2018 CLINICAL DATA:  Hypoxia. EXAM: CHEST - 2 VIEW COMPARISON:  Chest x-ray 8 Jan 27, 2018.  CT 2018-01-27, 09/05/2017. FINDINGS: Mediastinum and hilar structures are normal. Heart size stable. Left lower lobe atelectasis/consolidation with left-sided pleural  effusion. Small right pleural effusion. Elevation left hemidiaphragm again noted. No pneumothorax contrast noted over the colon. Degenerative change and osteopenia thoracic spine. IMPRESSION: 1. Left lower lobe atelectasis/consolidation with left-sided pleural effusion. Small right pleural effusion. These findings are new from prior exam. 2.  Elevation left hemidiaphragm again noted. Electronically Signed   By: Maisie Fus  Register   On: 01/21/2018 13:01   Dg Chest 2 View  Result Date: 01/27/18 CLINICAL DATA:  Left lower quadrant pain. EXAM: CHEST - 2 VIEW COMPARISON:  Eight/14/19 FINDINGS: Normal heart size. Aortic atherosclerosis. No pleural effusion or edema. No airspace opacities. Asymmetric elevation of left hemidiaphragm is again noted. No acute bone abnormalities noted. Spondylosis identified within the thoracic spine. IMPRESSION: No active cardiopulmonary disease. Aortic Atherosclerosis (ICD10-I70.0). Electronically Signed   By: Signa Kell M.D.   On: 2018-01-27 09:14   Ct Abdomen Pelvis W Contrast  Result Date: 2018-01-27 CLINICAL DATA:  LEFT lower quadrant pain. Nausea. PET-CT 02/16/2016 EXAM: CT ABDOMEN AND PELVIS WITH CONTRAST TECHNIQUE: Multidetector  CT imaging of the abdomen and pelvis was performed using the standard protocol following bolus administration of intravenous contrast. CONTRAST:  60mL OMNIPAQUE IOHEXOL 300 MG/ML  SOLN COMPARISON:  PET-CT 02/16/2016, CT chest 09/05/2017 FINDINGS: Lower chest: Mild nodular airspace disease in the RIGHT lower lobe is new from comparison CT 09/05/2017 Hepatobiliary: No focal hepatic lesion. Mild periportal edema. Postcholecystectomy. Pancreas: Pancreas is normal. No ductal dilatation. No pancreatic inflammation. Spleen: Normal spleen Adrenals/urinary tract: Adrenal glands normal. Benign cyst of the LEFT kidney. No renal obstruction. Exophytic from the lower pole of the RIGHT kidney there is a ovoid high-density mass measuring 2.7 cm with HU equal  70. High-density lesion on comparison noncontrast PET-CT scan of 02/16/2016 at equal density; however, lesion was smaller measuring 2.2 by 1.8 cm. Lesion had no significant metabolic activity at that time. No renal obstruction. There is moderate volume of gas within the bladder collecting non dependently (image 62/6). Stomach/Bowel: Stomach, small-bowel normal. Post appendectomy. There is mild thickening of the ascending colon. Small amount fluid surrounding the ascending colon. Transverse descending colon appear normal. Rectosigmoid colon normal. Vascular/Lymphatic: Abdominal aorta is normal caliber with atherosclerotic calcification. There is no retroperitoneal or periportal lymphadenopathy. No pelvic lymphadenopathy. Reproductive: Post hysterectomy Other: Moderate amount of simple fluid the posterior cul-de-sac. Musculoskeletal: no aggressive osseous lesion. Severe degenerate changes of the lower lumbar spine. IMPRESSION: 1. New nodular airspace disease in the RIGHT lower lobe could represent aspiration pneumonitis or pneumonia. 2. Mild thickening of the ascending colon suggests segmental colitis. Differential would include inflammatory bowel disease, infectious colitis, drug induced colitis, and ischemic colitis. 3. Moderate volume of gas within the bladder. Recommend correlation with instrumentation. 4. High-density mass exophytic from the lower pole of the RIGHT kidney is indeterminate. Recommend MRI with and without contrast for further evaluation. Electronically Signed   By: Genevive BiStewart  Edmunds M.D.   On: 01/03/2018 10:51   Dg Chest Port 1 View  Result Date: 01/22/2018 CLINICAL DATA:  Shortness of breath.  Current smoker.  Hypertension. EXAM: PORTABLE CHEST 1 VIEW COMPARISON:  01/21/2018. FINDINGS: LEFT lower lobe atelectasis and consolidation with LEFT pleural effusion is redemonstrated. Unchanged cardiomediastinal silhouette. Calcified tortuous aorta. Mild vascular congestion. IMPRESSION: No active  disease. Electronically Signed   By: Elsie StainJohn T Curnes M.D.   On: 01/22/2018 07:18   [place] Ekg Site Rite  Result Date: 01/22/2018 If Site Rite image not attached, placement could not be confirmed due to current cardiac rhythm.   Consults: Treatment Team:  Lamont DowdyKolluru, Sarath, MD   Subjective:    Overnight Issues: Overnight patient's respiratory status has deteriorated.  Presently short of breath, bronchospastic, probably fluid overloaded however unable to give diuresis secondary to hypotension  Objective:  Vital signs for last 24 hours: Temp:  [97.7 F (36.5 C)-98.6 F (37 C)] 98 F (36.7 C) (12/31 1936) Pulse Rate:  [90-121] 108 (01/01 0600) Resp:  [13-26] 26 (01/01 0600) BP: (89-133)/(40-95) 109/86 (01/01 0600) SpO2:  [78 %-95 %] 78 % (01/01 0600) Weight:  [56.3 kg] 56.3 kg (12/31 1100)  Hemodynamic parameters for last 24 hours:    Intake/Output from previous day: 12/31 0701 - 01/01 0700 In: 1218.3 [P.O.:355; I.V.:171.7; IV Piggyback:691.7] Out: 415 [Urine:415]  Intake/Output this shift: No intake/output data recorded.  Vent settings for last 24 hours:    Physical Exam:  Vital signs:       Please see the above listed vital signs Patient is awake, responsive and communicating, in obvious respiratory distress on heated high flow HEENT:  Trachea midline, accessory muscle utilization, no oral lesions appreciated Cardiovascular:           Tachycardia in the 130s Pulmonary:      Inspiratory crackles and expiratory rhonchi and wheezing diffusely Abdominal:      Hypoactive bowel sounds, not distended, no significant point tenderness to palpation Extremities:     Clubbing, cyanosis or edema noted Neurologic:      Patient moves all extremities, no clear focal deficits noted  Assessment/Plan:  Septic shock.  On meropenem.  Likely etiology is colitis ischemic versus infectious Will place central line On Neo-Synephrine, stress dose corticosteroids.   Respiratory failure.   Will place on BiPAP.  Discussed with husband, patient would not want to be on life support and be intubated.  Will clarify complete CODE STATUS with family  Acute renal failure.    Worsening renal function.  Potassium is 5.4, BUN 78, creatinine 3.05.  415 cc of urinary output yesterday.  Patient is progressing towards dialysis with CRRT secondary to blood pressure.  Will need to discuss with family whether they want to go this route or more supportive care/comfort  Atrial fibrillation.  Rapid ventricular response this morning.  Will bolus and infuse amiodarone  Hyponatremia.  134   Leukocytosis.  Secondary to sepsis  Heart failure with reduced ejection fraction  Melena.  No evidence of active bleeding, hemoglobin is stable  Elevated troponin at 0.12. Reflect supply demand ischemia  Right renal mass.  Will need further   Critical Care Total Time 40 minutes  Arvetta Araque 01/23/2018  *Care during the described time interval was provided by me and/or other providers on the critical care team.  I have reviewed this patient's available data, including medical history, events of note, physical examination and test results as part of my evaluation.

## 2018-01-23 NOTE — Progress Notes (Signed)
Pt had increased heart rate into 130-140s A fib this am, improved with IV amiodarone bolus and drip, currently at 16.7 mls/hour and pt is now in NSR , rate mid 80s.  Had increased WOB this am and increased anxiety, calling out she could not breathe, Placed on Bippap with O2 sats in the 98s , but still restless and calling out. Prec dex stated and current at 0.6 and Pt is awake and alert, but calm, sats 100% and RR 18-22. Pt urine output was very low all night, given IV Albumin, which improved BP, and then 80 of IV lasix, which had an output of post IV lasix for a total output today so far of 105 mls.  BP did improved and Neo being weaned, curently at 60 mcgs. Family at bedside and aware of VS and urine output. All questions answered as asked

## 2018-01-23 NOTE — Progress Notes (Signed)
[facility] -  at [facility]   PATIENT NAME: Christine Mcclain    MR#:  [id number]  DATE OF BIRTH:  September 20, 1940  SUBJECTIVE:   Agitated earlier.  Poor urine output.  On pressor support.  Family at bedside.  BiPAP placed earlier today.  REVIEW OF SYSTEMS:  Unobtainable due to patient being drowsy DRUG ALLERGIES:   Allergies  Allergen Reactions  . Gabapentin Hives  . Naproxen Hives  . Penicillins Hives    Has patient had a PCN reaction causing immediate rash, facial/tongue/throat swelling, SOB or lightheadedness with hypotension: No Has patient had a PCN reaction causing severe rash involving mucus membranes or skin necrosis: No Has patient had a PCN reaction that required hospitalization: No Has patient had a PCN reaction occurring within the last 10 years: No If all of the above answers are "NO", then may proceed with Cephalosporin use.  . Codeine Rash    VITALS:  Blood pressure 109/86, pulse (!) 108, temperature 97.7 F (36.5 C), resp. rate (!) 26, height 5\' 2"  (1.575 m), weight 56.3 kg, SpO2 (!) 78 %.  PHYSICAL EXAMINATION:  GENERAL:  78 y.o.-year-old patient lying in the bed .  Looks critically ill EYES: Pupils equal, round, reactive to light and accommodation. No scleral icterus. Extraocular muscles intact.  HEENT: Head atraumatic, normocephalic. Oropharynx and nasopharynx clear.  NECK:  Supple, no jugular venous distention. No thyroid enlargement, no tenderness.  LUNGS: Decreased air entry CARDIOVASCULAR: S1, S2 normal. No murmurs, rubs, or gallops.  ABDOMEN: Soft, tender , nondistended. Bowel sounds present.  EXTREMITIES: No pedal edema, cyanosis, or clubbing.  NEUROLOGIC: Drowsy.  Moving all 4 extremities PSYCHIATRIC: The patient is drowsy SKIN: No obvious rash, lesion, or ulcer.   LABORATORY PANEL:   CBC Recent Labs  Lab 01/23/18 0606  WBC 15.6*  HGB 14.0  HCT 43.5  PLT 118*    ------------------------------------------------------------------------------------------------------------------  Chemistries  Recent Labs  Lab 01/22/18 0837 01/23/18 0606  NA 132* 134*  K 5.1 5.4*  CL 108 109  CO2 18* 16*  GLUCOSE 107* 112*  BUN 70* 78*  CREATININE 2.82* 3.05*  CALCIUM 8.4* 8.7*  MG 2.1  --   AST 205*  --   ALT 66*  --   ALKPHOS 41  --   BILITOT 0.8  --    ------------------------------------------------------------------------------------------------------------------  Cardiac Enzymes Recent Labs  Lab 01/22/18 0706  TROPONINI 0.12*   ------------------------------------------------------------------------------------------------------------------  RADIOLOGY:  Dg Chest Port 1 View  Result Date: 01/22/2018 CLINICAL DATA:  Shortness of breath.  Current smoker.  Hypertension. EXAM: PORTABLE CHEST 1 VIEW COMPARISON:  01/21/2018. FINDINGS: LEFT lower lobe atelectasis and consolidation with LEFT pleural effusion is redemonstrated. Unchanged cardiomediastinal silhouette. Calcified tortuous aorta. Mild vascular congestion. IMPRESSION: No active disease. Electronically Signed   By: Elsie Stain M.D.   On: 01/22/2018 07:18   [place] Ekg Site Rite  Result Date: 01/22/2018 If Site Rite image not attached, placement could not be confirmed due to current cardiac rhythm.   EKG:   Orders placed or performed during the hospital encounter of 12/29/2017  . EKG 12-Lead  . EKG 12-Lead  . ED EKG  . ED EKG  . EKG 12-Lead  . EKG 12-Lead  . EKG 12-Lead  . EKG 12-Lead    ASSESSMENT AND PLAN:   # Septic shock Due to colitis and pneumonia. Continue pressor support. On antibiotics.  Worsening.  #Acute hypoxic respiratory failure secondary to CHF and pneumonia continue BiPAP support  # Acute ascending  colitis Antibiotics changed to IV meropenem No blood in stool.  # Right lower lobe pneumonia. On antibiotics.  Flu negative.  Nebulizers as needed.  # Acute  kidney injury from ATN and IV contrast   Monitor input and output. Discussed with Dr. Wynelle Link of nephrology Is likely heading towards CRRT  #Elevated troponin probably demand ischemia patient  No significant increase  #Acute on  Chronic systolic congestive heart failure with previous ejection fraction 30-35% Transfer to ICU and will need diuresis or CRRT per progress  #Right renal mass.  Will need outpatient MRI.  Discussed with patient and family at bedside earlier during admission Unable to get MRI with contrast here due to acute kidney injury  #History of COPD - no exacerbation Bronchodilator treatment Patient needs a CT chest eventually to rule out malignancy for ongoing tobacco abuse.  #Tobacco abuse disorder  counseled on admission  All the records are reviewed and case discussed with Care Management/Social Workerr. Management plans discussed with the patient, family and they are in agreement.  CODE STATUS: FULL CODE  TOTAL CRITICAL CARE TIME TAKING CARE OF THIS PATIENT: 35 minutes  Note: This dictation was prepared with Dragon dictation along with smaller phrase technology. Any transcriptional errors that result from this process are unintentional.  Orie Fisherman M.D on 01/23/2018 at 1:29 PM  Between 7am to 6pm - Pager - [phone]  After 6pm go to [email or web address] - password [email or web address] [facility]  [facility]  Office  [phone]  CC: Primary care physician; Hillery Aldo, MD

## 2018-01-23 NOTE — Progress Notes (Signed)
[facility]  ROUNDING NOTE   Subjective:   Creatinine 3.05 (2.82) BUN 112 (107)  Phenylephrine gtt.   Family at bedside.   Placed on BIPAP.   Made DNI this morning.   Objective:  Vital signs in last 24 hours:  Temp:  [97.7 F (36.5 C)-98.6 F (37 C)] 97.7 F (36.5 C) (01/01 0701) Pulse Rate:  [90-121] 108 (01/01 0600) Resp:  [13-26] 26 (01/01 0600) BP: (89-133)/(40-95) 109/86 (01/01 0600) SpO2:  [78 %-95 %] 78 % (01/01 0600)  Weight change: -3.7 kg Filed Weights   01/20/18 0500 01/22/18 0455 01/22/18 1100  Weight: 54.4 kg 60 kg 56.3 kg    Intake/Output: I/O last 3 completed shifts: In: 2398.3 [P.O.:355; I.V.:1301.7; IV Piggyback:741.7] Out: 565 [Urine:565]   Intake/Output this shift:  No intake/output data recorded.  Physical Exam: General: Critically ill,   Head: BIPAP  Eyes: Anicteric, PERRL  Neck: Supple, trachea midline  Lungs:  clear  Heart: irregular  Abdomen:  +tender  Extremities:  no peripheral edema.  Neurologic: Nonfocal, moving all four extremities  Skin: No lesions        Basic Metabolic Panel: Recent Labs  Lab 01/20/18 0409 01/21/18 0532 01/22/18 0539 01/22/18 0837 01/23/18 0606  NA 135 133* 132* 132* 134*  K 5.3* 5.1 5.1 5.1 5.4*  CL 108 105 106 108 109  CO2 22 19* 18* 18* 16*  GLUCOSE 126* 126* 117* 107* 112*  BUN 55* 63* 72* 70* 78*  CREATININE 1.60* 2.21* 2.83* 2.82* 3.05*  CALCIUM 8.2* 8.5* 8.8* 8.4* 8.7*  MG  --   --   --  2.1  --   PHOS  --   --   --  6.4*  --     Liver Function Tests: Recent Labs  Lab 02-12-2018 0857 01/22/18 0837  AST 273* 205*  ALT 79* 66*  ALKPHOS 51 41  BILITOT 0.7 0.8  PROT 6.0* 4.4*  ALBUMIN 2.7* 1.9*   No results for input(s): LIPASE, AMYLASE in the last 168 hours. No results for input(s): AMMONIA in the last 168 hours.  CBC: Recent Labs  Lab 02-12-18 0857  01/19/18 0506 01/21/18 0532 01/22/18 0837 01/22/18 1052 01/23/18 0606  WBC 12.0*   < > 9.6 13.9* 15.4*  14.5* 15.6*  NEUTROABS 9.8*  --   --  11.5* 12.9* 12.3*  --   HGB 13.8   < > 13.5 14.5 12.8 13.6 14.0  HCT 41.5   < > 41.0 45.1 39.5 42.0 43.5  MCV 100.0   < > 100.5* 101.8* 101.8* 101.7* 102.1*  PLT PLATELET CLUMPS NOTED ON SMEAR, UNABLE TO ESTIMATE   < > PLATELET CLUMPS NOTED ON SMEAR, UNABLE TO ESTIMATE PLATELET CLUMPS NOTED ON SMEAR, UNABLE TO ESTIMATE 130* 119* 118*   < > = values in this interval not displayed.    Cardiac Enzymes: Recent Labs  Lab Feb 12, 2018 0857 01/22/18 0706  TROPONINI 0.10* 0.12*    BNP: Invalid input(s): POCBNP  CBG: Recent Labs  Lab 01/20/18 0749 01/21/18 0730 01/22/18 0642 01/22/18 0735 01/22/18 1113  GLUCAP 101* 93 99 105* 91    Microbiology: Results for orders placed or performed during the hospital encounter of 02/12/18  Respiratory Panel by PCR     Status: None   Collection Time: 01/18/18 10:28 AM  Result Value Ref Range Status   Adenovirus NOT DETECTED NOT DETECTED Final   Coronavirus 229E NOT DETECTED NOT DETECTED Final   Coronavirus HKU1 NOT DETECTED NOT DETECTED Final  Coronavirus NL63 NOT DETECTED NOT DETECTED Final   Coronavirus OC43 NOT DETECTED NOT DETECTED Final   Metapneumovirus NOT DETECTED NOT DETECTED Final   Rhinovirus / Enterovirus NOT DETECTED NOT DETECTED Final   Influenza A NOT DETECTED NOT DETECTED Final   Influenza B NOT DETECTED NOT DETECTED Final   Parainfluenza Virus 1 NOT DETECTED NOT DETECTED Final   Parainfluenza Virus 2 NOT DETECTED NOT DETECTED Final   Parainfluenza Virus 3 NOT DETECTED NOT DETECTED Final   Parainfluenza Virus 4 NOT DETECTED NOT DETECTED Final   Respiratory Syncytial Virus NOT DETECTED NOT DETECTED Final   Bordetella pertussis NOT DETECTED NOT DETECTED Final   Chlamydophila pneumoniae NOT DETECTED NOT DETECTED Final   Mycoplasma pneumoniae NOT DETECTED NOT DETECTED Final    Comment: Performed at [facility], [address], [place], [place] [address]  MRSA PCR Screening      Status: None   Collection Time: 01/22/18 10:52 AM  Result Value Ref Range Status   MRSA by PCR NEGATIVE NEGATIVE Final    Comment:        The GeneXpert MRSA Assay (FDA approved for NASAL specimens only), is one component of a comprehensive MRSA colonization surveillance program. It is not intended to diagnose MRSA infection nor to guide or monitor treatment for MRSA infections. Performed at [facility], [address] [place], [place] [address]     Coagulation Studies: Recent Labs    01/22/18 1052  LABPROT 17.6*  INR 1.46    Urinalysis: No results for input(s): COLORURINE, LABSPEC, PHURINE, GLUCOSEU, HGBUR, BILIRUBINUR, KETONESUR, PROTEINUR, UROBILINOGEN, NITRITE, LEUKOCYTESUR in the last 72 hours.  Invalid input(s): APPERANCEUR    Imaging: Dg Chest Port 1 View  Result Date: 01/22/2018 CLINICAL DATA:  Shortness of breath.  Current smoker.  Hypertension. EXAM: PORTABLE CHEST 1 VIEW COMPARISON:  01/21/2018. FINDINGS: LEFT lower lobe atelectasis and consolidation with LEFT pleural effusion is redemonstrated. Unchanged cardiomediastinal silhouette. Calcified tortuous aorta. Mild vascular congestion. IMPRESSION: No active disease. Electronically Signed   By: Elsie Stain M.D.   On: 01/22/2018 07:18   [place] Ekg Site Rite  Result Date: 01/22/2018 If Site Rite image not attached, placement could not be confirmed due to current cardiac rhythm.    Medications:   . albumin human    . amiodarone 60 mg/hr (01/23/18 1213)   Followed by  . amiodarone    . dexmedetomidine (PRECEDEX) IV infusion 0.6 mcg/kg/hr (01/23/18 1123)  . famotidine (PEPCID) IV 20 mg (01/23/18 0944)  . meropenem (MERREM) IV 500 mg (01/23/18 1119)  . phenylephrine (NEO-SYNEPHRINE) Adult infusion 80 mcg/min (01/23/18 1215)   . budesonide (PULMICORT) nebulizer solution  0.5 mg Nebulization BID  . feeding supplement (ENSURE ENLIVE)  237 mL Oral BID BM  . furosemide  80 mg Intravenous Once  .  ipratropium-albuterol  3 mL Nebulization Q6H  . magic mouthwash  5 mL Oral QID   And  . lidocaine  5 mL Mouth/Throat TID  . mouth rinse  15 mL Mouth Rinse BID  . methylPREDNISolone (SOLU-MEDROL) injection  60 mg Intravenous Q6H  . sodium chloride flush  10-40 mL Intracatheter Q12H  . vitamin C  250 mg Oral BID   acetaminophen **OR** acetaminophen, bisacodyl, HYDROcodone-acetaminophen, ipratropium-albuterol, ondansetron **OR** ondansetron (ZOFRAN) IV, phenol, sodium chloride flush, traZODone  Assessment/ Plan:  [name] is a 78 y.o. white female with COPD, hypertension, hyperlipidemia, osteoarthritis, osteopenia, systolic congestive heart failure EF 30% who was admitted to [facility] on 12/23/2017 for  colitis, pneumonia and acute renal failure  1. Acute kidney failure on chronic kidney disease stage III: baseline creatinine of 1.18, GFR of 43 on 08/15/17.  2. Hyponatremia 3. Hyperkalemia 4. Metabolic acidosis 5. Hematuria 6. Renal Mass right - incidental finding.   Plan:  Chronic kidney disease secondary to hypertension Acute renal failure secondary to IV contrast exposure, prerenal azotemia and hypotension  - holding naproxen.  - Needs outpatient follow up on renal mass.  - Furosemide IV x 1 today.  - Discussed with family about overall prognosis. Husband states that patient would want dialysis if indicated.    LOS: 6 Jahid Weida 1/1/202012:57 PM

## 2018-01-23 DEATH — deceased

## 2018-01-24 LAB — BASIC METABOLIC PANEL
Anion gap: 8 (ref 5–15)
BUN: 85 mg/dL — ABNORMAL HIGH (ref 8–23)
CO2: 17 mmol/L — ABNORMAL LOW (ref 22–32)
CREATININE: 3.38 mg/dL — AB (ref 0.44–1.00)
Calcium: 8.6 mg/dL — ABNORMAL LOW (ref 8.9–10.3)
Chloride: 109 mmol/L (ref 98–111)
GFR calc Af Amer: 14 mL/min — ABNORMAL LOW (ref >60)
GFR calc non Af Amer: 12 mL/min — ABNORMAL LOW (ref >60)
Glucose, Bld: 169 mg/dL — ABNORMAL HIGH (ref 70–99)
Potassium: 5 mmol/L (ref 3.5–5.1)
Sodium: 134 mmol/L — ABNORMAL LOW (ref 135–145)

## 2018-01-24 LAB — GLUCOSE, CAPILLARY
Glucose-Capillary: 132 mg/dL — ABNORMAL HIGH (ref 70–99)
Glucose-Capillary: 123 mg/dL — ABNORMAL HIGH (ref 70–99)
Glucose-Capillary: 131 mg/dL — ABNORMAL HIGH (ref 70–99)
Glucose-Capillary: 125 mg/dL — ABNORMAL HIGH (ref 70–99)

## 2018-01-24 MED ORDER — PANTOPRAZOLE SODIUM 40 MG IV SOLR
40.0000 mg | INTRAVENOUS | Status: DC
  Administered 2018-01-25 – 2018-01-29 (×5): 40 mg via INTRAVENOUS
  Filled 2018-01-24 (×5): qty 40

## 2018-01-24 MED ORDER — NYSTATIN 100000 UNIT/ML MT SUSP
5.0000 mL | Freq: Four times a day (QID) | OROMUCOSAL | Status: DC
  Administered 2018-01-24 – 2018-01-29 (×10): 500000 [IU] via ORAL
  Filled 2018-01-24 (×24): qty 5

## 2018-01-24 MED ORDER — FUROSEMIDE 10 MG/ML IJ SOLN
80.0000 mg | Freq: Once | INTRAMUSCULAR | Status: AC
  Administered 2018-01-24: 80 mg via INTRAVENOUS
  Filled 2018-01-24: qty 8

## 2018-01-24 NOTE — Progress Notes (Signed)
[facility]  ROUNDING NOTE   Subjective:   Creatinine 3.38 (3.05) (2.82)  Phenylephrine gtt.   UOP 540 - furosemide IV 80mg  x 1  Family at bedside.   Patient sedated  Objective:  Vital signs in last 24 hours:  Temp:  [95.6 F (35.3 C)-99.2 F (37.3 C)] 96.9 F (36.1 C) (01/02 0400) Pulse Rate:  [76-137] 92 (01/02 0615) Resp:  [14-30] 22 (01/02 0600) BP: (65-123)/(43-89) 101/57 (01/02 0615) SpO2:  [88 %-100 %] 96 % (01/02 0615) FiO2 (%):  [70 %] 70 % (01/02 0120) Weight:  [60 kg] 60 kg (01/02 0500)  Weight change: 3.7 kg Filed Weights   01/22/18 0455 01/22/18 1100 01/24/18 0500  Weight: 60 kg 56.3 kg 60 kg    Intake/Output: I/O last 3 completed shifts: In: 1887.6 [P.O.:60; I.V.:1329; IV Piggyback:498.7] Out: 640 [Urine:640]   Intake/Output this shift:  No intake/output data recorded.  Physical Exam: General: Critically ill,   Head: [place]/AT +thrush  Eyes: Anicteric, PERRL  Neck: Supple, trachea midline  Lungs:  clear  Heart: irregular  Abdomen:  +tender  Extremities:  no peripheral edema.  Neurologic: Nonfocal, moving all four extremities  Skin: No lesions        Basic Metabolic Panel: Recent Labs  Lab 01/22/18 0539 01/22/18 0837 01/23/18 0606 01/23/18 1909 01/24/18 0037  NA 132* 132* 134* 134* 134*  K 5.1 5.1 5.4* 5.3* 5.0  CL 106 108 109 110 109  CO2 18* 18* 16* 16* 17*  GLUCOSE 117* 107* 112* 139* 169*  BUN 72* 70* 78* 83* 85*  CREATININE 2.83* 2.82* 3.05* 3.36* 3.38*  CALCIUM 8.8* 8.4* 8.7* 8.6* 8.6*  MG  --  2.1  --   --   --   PHOS  --  6.4*  --   --   --     Liver Function Tests: Recent Labs  Lab July 14, 2017 0857 01/22/18 0837  AST 273* 205*  ALT 79* 66*  ALKPHOS 51 41  BILITOT 0.7 0.8  PROT 6.0* 4.4*  ALBUMIN 2.7* 1.9*   No results for input(s): LIPASE, AMYLASE in the last 168 hours. No results for input(s): AMMONIA in the last 168 hours.  CBC: Recent Labs  Lab July 14, 2017 0857  01/19/18 0506 01/21/18 0532  01/22/18 0837 01/22/18 1052 01/23/18 0606  WBC 12.0*   < > 9.6 13.9* 15.4* 14.5* 15.6*  NEUTROABS 9.8*  --   --  11.5* 12.9* 12.3*  --   HGB 13.8   < > 13.5 14.5 12.8 13.6 14.0  HCT 41.5   < > 41.0 45.1 39.5 42.0 43.5  MCV 100.0   < > 100.5* 101.8* 101.8* 101.7* 102.1*  PLT PLATELET CLUMPS NOTED ON SMEAR, UNABLE TO ESTIMATE   < > PLATELET CLUMPS NOTED ON SMEAR, UNABLE TO ESTIMATE PLATELET CLUMPS NOTED ON SMEAR, UNABLE TO ESTIMATE 130* 119* 118*   < > = values in this interval not displayed.    Cardiac Enzymes: Recent Labs  Lab July 14, 2017 0857 01/22/18 0706  TROPONINI 0.10* 0.12*    BNP: Invalid input(s): POCBNP  CBG: Recent Labs  Lab 01/22/18 0642 01/22/18 0735 01/22/18 1113 01/23/18 2207 01/24/18 0357  GLUCAP 99 105* 91 112* 132*    Microbiology: Results for orders placed or performed during the hospital encounter of July 14, 2017  Respiratory Panel by PCR     Status: None   Collection Time: 01/18/18 10:28 AM  Result Value Ref Range Status   Adenovirus NOT DETECTED NOT DETECTED Final   Coronavirus  229E NOT DETECTED NOT DETECTED Final   Coronavirus HKU1 NOT DETECTED NOT DETECTED Final   Coronavirus NL63 NOT DETECTED NOT DETECTED Final   Coronavirus OC43 NOT DETECTED NOT DETECTED Final   Metapneumovirus NOT DETECTED NOT DETECTED Final   Rhinovirus / Enterovirus NOT DETECTED NOT DETECTED Final   Influenza A NOT DETECTED NOT DETECTED Final   Influenza B NOT DETECTED NOT DETECTED Final   Parainfluenza Virus 1 NOT DETECTED NOT DETECTED Final   Parainfluenza Virus 2 NOT DETECTED NOT DETECTED Final   Parainfluenza Virus 3 NOT DETECTED NOT DETECTED Final   Parainfluenza Virus 4 NOT DETECTED NOT DETECTED Final   Respiratory Syncytial Virus NOT DETECTED NOT DETECTED Final   Bordetella pertussis NOT DETECTED NOT DETECTED Final   Chlamydophila pneumoniae NOT DETECTED NOT DETECTED Final   Mycoplasma pneumoniae NOT DETECTED NOT DETECTED Final    Comment: Performed at [facility], [address] [place], [place] [address]  MRSA PCR Screening     Status: None   Collection Time: 01/22/18 10:52 AM  Result Value Ref Range Status   MRSA by PCR NEGATIVE NEGATIVE Final    Comment:        The GeneXpert MRSA Assay (FDA approved for NASAL specimens only), is one component of a comprehensive MRSA colonization surveillance program. It is not intended to diagnose MRSA infection nor to guide or monitor treatment for MRSA infections. Performed at [facility], [address] [place], [place] [address]     Coagulation Studies: Recent Labs    01/22/18 1052  LABPROT 17.6*  INR 1.46    Urinalysis: No results for input(s): COLORURINE, LABSPEC, PHURINE, GLUCOSEU, HGBUR, BILIRUBINUR, KETONESUR, PROTEINUR, UROBILINOGEN, NITRITE, LEUKOCYTESUR in the last 72 hours.  Invalid input(s): APPERANCEUR    Imaging: [place] Ekg Site Rite  Result Date: 01/22/2018 If Site Rite image not attached, placement could not be confirmed due to current cardiac rhythm.    Medications:   . amiodarone 30 mg/hr (01/24/18 0600)  . dexmedetomidine (PRECEDEX) IV infusion 0.5 mcg/kg/hr (01/24/18 0600)  . famotidine (PEPCID) IV 20 mg (01/24/18 0805)  . meropenem (MERREM) IV Stopped (01/23/18 2308)  . phenylephrine (NEO-SYNEPHRINE) Adult infusion 40 mcg/min (01/24/18 0600)   . budesonide (PULMICORT) nebulizer solution  0.5 mg Nebulization BID  . feeding supplement (ENSURE ENLIVE)  237 mL Oral BID BM  . ipratropium-albuterol  3 mL Nebulization Q6H  . magic mouthwash  5 mL Oral QID   And  . lidocaine  5 mL Mouth/Throat TID  . mouth rinse  15 mL Mouth Rinse BID  . methylPREDNISolone (SOLU-MEDROL) injection  60 mg Intravenous Q6H  . sodium chloride flush  10-40 mL Intracatheter Q12H  . vitamin C  250 mg Oral BID   acetaminophen **OR** acetaminophen, bisacodyl, HYDROcodone-acetaminophen, ipratropium-albuterol, ondansetron **OR** ondansetron (ZOFRAN) IV, phenol, sodium chloride  flush, traZODone  Assessment/ Plan:  Christine Mcclain is a 78 y.o. white female with COPD, hypertension, hyperlipidemia, osteoarthritis, osteopenia, systolic congestive heart failure EF 30% who was admitted to [facility] on 2018-01-25 for colitis, pneumonia and acute renal failure  1. Acute kidney failure on chronic kidney disease stage III: baseline creatinine of 1.18, GFR of 43 on 08/15/17.  2. Hyponatremia 3. Hyperkalemia 4. Metabolic acidosis 5. Hematuria 6. Renal Mass right - incidental finding.   Plan:  Chronic kidney disease secondary to hypertension Acute renal failure secondary to IV contrast exposure, prerenal azotemia and hypotension  - holding naproxen.  - Needs outpatient follow up on renal  mass.  - Furosemide IV x 1 again  today.  - Discussed with family about overall prognosis. Husband states that patient would want dialysis if indicated.    LOS: 7 Christine Mcclain 1/2/20208:32 AM

## 2018-01-24 NOTE — Consult Note (Signed)
Pharmacy Antibiotic Note  Christine Mcclain is a 78 y.o. female admitted on 01/08/2018 with Colitis. She was started on Cipro/Flagyl on admission. 12/31 pharmacy consulted for meropenem dosing for intra-abdominal infection.   Plan: Continue meropenem 500mg  every 12 hours.   Height: 5\' 2"  (157.5 cm) Weight: 132 lb 4.4 oz (60 kg) IBW/kg (Calculated) : 50.1  Temp (24hrs), Avg:97.5 F (36.4 C), Min:95.6 F (35.3 C), Max:99.2 F (37.3 C)  Recent Labs  Lab 01/19/18 0506  01/21/18 0532 01/22/18 0539 01/22/18 0837 01/22/18 1052 01/23/18 0606 01/23/18 1909 01/24/18 0037  WBC 9.6  --  13.9*  --  15.4* 14.5* 15.6*  --   --   CREATININE 1.41*   < > 2.21* 2.83* 2.82*  --  3.05* 3.36* 3.38*  LATICACIDVEN  --   --   --   --  1.4 1.2  --   --   --    < > = values in this interval not displayed.    Estimated Creatinine Clearance: 10.7 mL/min (A) (by C-G formula based on SCr of 3.38 mg/dL (H)).    Allergies  Allergen Reactions  . Gabapentin Hives  . Naproxen Hives  . Penicillins Hives    Has patient had a PCN reaction causing immediate rash, facial/tongue/throat swelling, SOB or lightheadedness with hypotension: No Has patient had a PCN reaction causing severe rash involving mucus membranes or skin necrosis: No Has patient had a PCN reaction that required hospitalization: No Has patient had a PCN reaction occurring within the last 10 years: No If all of the above answers are "NO", then may proceed with Cephalosporin use.  . Codeine Rash    Antimicrobials this admission: 12/26 Flagyl  >>  12/31  12/27 Cipro  >> 12/31  12/31 meropenem >>    Microbiology results: 12/27: Resp Panel PCR (-)  12/31 MRSA PCR (-)   Thank you for allowing pharmacy to be a part of this patient's care.  Gardner Candle, PharmD, BCPS Clinical Pharmacist 01/24/2018 11:43 AM

## 2018-01-24 NOTE — Progress Notes (Signed)
Patient declines Bipap. Resting comfortably in bed on 3.5L [place] SAT at 100%.Will continue to monitor.

## 2018-01-24 NOTE — Progress Notes (Signed)
Follow up - Critical Care Medicine Note  Patient Details:    Christine Mcclain is an 78 y.o. female.with a past medical history remarkable for hypertension, hyperlipidemia, COPD, asthma, arthritis, was admitted on 12/26 after developing left lower quadrant pain associated with nausea.  She also was passing black stool.  Was admitted and treated for colitis, fluid resuscitation, antibiotics with Cipro and Flagyl.  She was also noted during hospitalization to have a right lower lobe pneumonia, possible melena, acute renal injury, elevated troponin, chronic systolic heart failure with an ejection fraction of 30%, right renal mass being followed.  Rapid response secondary to decreasing blood pressure and loss of IV access. Patient is also having some increasing shortness of breath and is being moved to the intensive care unit for further therapy   Lines, Airways, Drains: CVC Triple Lumen 01/22/18 Right Femoral 20 cm (Active)  Indication for Insertion or Continuance of Line Vasoactive infusions 01/22/2018  8:10 PM  Site Assessment Clean;Dry;Intact 01/22/2018  8:10 PM  Proximal Lumen Status Flushed;Saline locked 01/22/2018  8:10 PM  Medial Lumen Status Infusing;Flushed 01/22/2018  8:10 PM  Distal Lumen Status Flushed;Saline locked 01/22/2018  8:10 PM  Dressing Type Transparent;Occlusive 01/22/2018  8:10 PM  Dressing Status Clean;Dry;Intact;Antimicrobial disc in place 01/22/2018  8:10 PM  Line Care Connections checked and tightened 01/22/2018  8:10 PM  Dressing Intervention New dressing 01/22/2018 11:00 AM  Dressing Change Due 01/29/18 01/22/2018  8:10 PM     Urethral Catheter C Green, RN Double-lumen;Latex 14 Fr. (Active)  Indication for Insertion or Continuance of Catheter Unstable critical patients (first 24-48 hours) 01/23/2018  5:17 AM  Site Assessment Clean;Intact 01/23/2018  5:17 AM  Date Prophylactic Dressing Applied (if applicable) 01/22/18 01/22/2018 11:00 AM  Catheter Maintenance Bag below level  of bladder 01/23/2018  5:17 AM  Collection Container Standard drainage bag 01/23/2018  5:17 AM  Securement Method Securing device (Describe) 01/23/2018  5:17 AM  Output (mL) 315 mL 01/22/2018  4:00 PM    Anti-infectives:  Anti-infectives (From admission, onward)   Start     Dose/Rate Route Frequency Ordered Stop   01/22/18 1130  meropenem (MERREM) 500 mg in sodium chloride 0.9 % 100 mL IVPB     500 mg 200 mL/hr over 30 Minutes Intravenous Every 12 hours 01/22/18 1129     01/19/18 1700  ciprofloxacin (CIPRO) tablet 500 mg  Status:  Discontinued     500 mg Oral Daily with supper 01/19/18 1331 01/22/18 1058   01/19/18 0600  metroNIDAZOLE (FLAGYL) tablet 500 mg  Status:  Discontinued     500 mg Oral Every 8 hours 01/18/18 1442 01/22/18 1058   01/18/18 2000  ciprofloxacin (CIPRO) tablet 250 mg  Status:  Discontinued     250 mg Oral 2 times daily 01/18/18 1442 01/18/18 1628   01/18/18 1800  ciprofloxacin (CIPRO) IVPB 400 mg  Status:  Discontinued     400 mg 200 mL/hr over 60 Minutes Intravenous Every 24 hours 01/18/18 1628 01/19/18 1331   01/18/18 0000  levofloxacin (LEVAQUIN) IVPB 750 mg  Status:  Discontinued     750 mg 100 mL/hr over 90 Minutes Intravenous  Once 01/22/2018 1159 01/06/2018 1159   01/04/2018 1200  ciprofloxacin (CIPRO) IVPB 400 mg  Status:  Discontinued     400 mg 200 mL/hr over 60 Minutes Intravenous Every 12 hours 01/01/2018 1158 01/18/2018 1159   01/13/2018 1200  metroNIDAZOLE (FLAGYL) IVPB 500 mg  Status:  Discontinued     500 mg 100  mL/hr over 60 Minutes Intravenous Every 8 hours 2018/01/27 1158 2018/01/27 1159   01-27-18 1200  metroNIDAZOLE (FLAGYL) IVPB 500 mg     500 mg 100 mL/hr over 60 Minutes Intravenous Every 8 hours 01-27-2018 1159 01/18/18 2318   01/27/2018 1130  levofloxacin (LEVAQUIN) IVPB 750 mg     750 mg 100 mL/hr over 90 Minutes Intravenous  Once 2018-01-27 1116 27-Jan-2018 1336      Microbiology: Results for orders placed or performed during the hospital encounter of  01-27-2018  Respiratory Panel by PCR     Status: None   Collection Time: 01/18/18 10:28 AM  Result Value Ref Range Status   Adenovirus NOT DETECTED NOT DETECTED Final   Coronavirus 229E NOT DETECTED NOT DETECTED Final   Coronavirus HKU1 NOT DETECTED NOT DETECTED Final   Coronavirus NL63 NOT DETECTED NOT DETECTED Final   Coronavirus OC43 NOT DETECTED NOT DETECTED Final   Metapneumovirus NOT DETECTED NOT DETECTED Final   Rhinovirus / Enterovirus NOT DETECTED NOT DETECTED Final   Influenza A NOT DETECTED NOT DETECTED Final   Influenza B NOT DETECTED NOT DETECTED Final   Parainfluenza Virus 1 NOT DETECTED NOT DETECTED Final   Parainfluenza Virus 2 NOT DETECTED NOT DETECTED Final   Parainfluenza Virus 3 NOT DETECTED NOT DETECTED Final   Parainfluenza Virus 4 NOT DETECTED NOT DETECTED Final   Respiratory Syncytial Virus NOT DETECTED NOT DETECTED Final   Bordetella pertussis NOT DETECTED NOT DETECTED Final   Chlamydophila pneumoniae NOT DETECTED NOT DETECTED Final   Mycoplasma pneumoniae NOT DETECTED NOT DETECTED Final    Comment: Performed at [facility], [address] [place], [place] [address]  MRSA PCR Screening     Status: None   Collection Time: 01/22/18 10:52 AM  Result Value Ref Range Status   MRSA by PCR NEGATIVE NEGATIVE Final    Comment:        The GeneXpert MRSA Assay (FDA approved for NASAL specimens only), is one component of a comprehensive MRSA colonization surveillance program. It is not intended to diagnose MRSA infection nor to guide or monitor treatment for MRSA infections. Performed at [facility], [address], [place], [place] [address]    Studies: Dg Chest 2 View  Result Date: 01/21/2018 CLINICAL DATA:  Hypoxia. EXAM: CHEST - 2 VIEW COMPARISON:  Chest x-ray 8 Jan 27, 2018.  CT 2018-01-27, 09/05/2017. FINDINGS: Mediastinum and hilar structures are normal. Heart size stable. Left lower lobe atelectasis/consolidation with left-sided pleural  effusion. Small right pleural effusion. Elevation left hemidiaphragm again noted. No pneumothorax contrast noted over the colon. Degenerative change and osteopenia thoracic spine. IMPRESSION: 1. Left lower lobe atelectasis/consolidation with left-sided pleural effusion. Small right pleural effusion. These findings are new from prior exam. 2.  Elevation left hemidiaphragm again noted. Electronically Signed   By: Maisie Fus  Register   On: 01/21/2018 13:01   Dg Chest 2 View  Result Date: 01/27/18 CLINICAL DATA:  Left lower quadrant pain. EXAM: CHEST - 2 VIEW COMPARISON:  Eight/14/19 FINDINGS: Normal heart size. Aortic atherosclerosis. No pleural effusion or edema. No airspace opacities. Asymmetric elevation of left hemidiaphragm is again noted. No acute bone abnormalities noted. Spondylosis identified within the thoracic spine. IMPRESSION: No active cardiopulmonary disease. Aortic Atherosclerosis (ICD10-I70.0). Electronically Signed   By: Signa Kell M.D.   On: 2018-01-27 09:14   Ct Abdomen Pelvis W Contrast  Result Date: 2018-01-27 CLINICAL DATA:  LEFT lower quadrant pain. Nausea. PET-CT 02/16/2016 EXAM: CT ABDOMEN AND PELVIS WITH CONTRAST TECHNIQUE: Multidetector  CT imaging of the abdomen and pelvis was performed using the standard protocol following bolus administration of intravenous contrast. CONTRAST:  60mL OMNIPAQUE IOHEXOL 300 MG/ML  SOLN COMPARISON:  PET-CT 02/16/2016, CT chest 09/05/2017 FINDINGS: Lower chest: Mild nodular airspace disease in the RIGHT lower lobe is new from comparison CT 09/05/2017 Hepatobiliary: No focal hepatic lesion. Mild periportal edema. Postcholecystectomy. Pancreas: Pancreas is normal. No ductal dilatation. No pancreatic inflammation. Spleen: Normal spleen Adrenals/urinary tract: Adrenal glands normal. Benign cyst of the LEFT kidney. No renal obstruction. Exophytic from the lower pole of the RIGHT kidney there is a ovoid high-density mass measuring 2.7 cm with HU equal  70. High-density lesion on comparison noncontrast PET-CT scan of 02/16/2016 at equal density; however, lesion was smaller measuring 2.2 by 1.8 cm. Lesion had no significant metabolic activity at that time. No renal obstruction. There is moderate volume of gas within the bladder collecting non dependently (image 62/6). Stomach/Bowel: Stomach, small-bowel normal. Post appendectomy. There is mild thickening of the ascending colon. Small amount fluid surrounding the ascending colon. Transverse descending colon appear normal. Rectosigmoid colon normal. Vascular/Lymphatic: Abdominal aorta is normal caliber with atherosclerotic calcification. There is no retroperitoneal or periportal lymphadenopathy. No pelvic lymphadenopathy. Reproductive: Post hysterectomy Other: Moderate amount of simple fluid the posterior cul-de-sac. Musculoskeletal: no aggressive osseous lesion. Severe degenerate changes of the lower lumbar spine. IMPRESSION: 1. New nodular airspace disease in the RIGHT lower lobe could represent aspiration pneumonitis or pneumonia. 2. Mild thickening of the ascending colon suggests segmental colitis. Differential would include inflammatory bowel disease, infectious colitis, drug induced colitis, and ischemic colitis. 3. Moderate volume of gas within the bladder. Recommend correlation with instrumentation. 4. High-density mass exophytic from the lower pole of the RIGHT kidney is indeterminate. Recommend MRI with and without contrast for further evaluation. Electronically Signed   By: Genevive BiStewart  Edmunds M.D.   On: 12/27/2017 10:51   Dg Chest Port 1 View  Result Date: 01/22/2018 CLINICAL DATA:  Shortness of breath.  Current smoker.  Hypertension. EXAM: PORTABLE CHEST 1 VIEW COMPARISON:  01/21/2018. FINDINGS: LEFT lower lobe atelectasis and consolidation with LEFT pleural effusion is redemonstrated. Unchanged cardiomediastinal silhouette. Calcified tortuous aorta. Mild vascular congestion. IMPRESSION: No active  disease. Electronically Signed   By: Elsie StainJohn T Curnes M.D.   On: 01/22/2018 07:18   [place] Ekg Site Rite  Result Date: 01/22/2018 If Site Rite image not attached, placement could not be confirmed due to current cardiac rhythm.   Consults: Treatment Team:  Lamont DowdyKolluru, Sarath, MD   Subjective:    Overnight Issues: Patient status improved.  Did well on BiPAP and Precedex, responded to albumin and Lasix, converted with amiodarone.  Resting much more comfortably today  Objective:  Vital signs for last 24 hours: Temp:  [95.6 F (35.3 C)-99.2 F (37.3 C)] 96.9 F (36.1 C) (01/02 0400) Pulse Rate:  [76-121] 92 (01/02 0615) Resp:  [14-30] 22 (01/02 0600) BP: (74-123)/(43-89) 101/57 (01/02 0615) SpO2:  [91 %-100 %] 96 % (01/02 0615) FiO2 (%):  [70 %] 70 % (01/02 0120) Weight:  [60 kg] 60 kg (01/02 0500)  Hemodynamic parameters for last 24 hours:    Intake/Output from previous day: 01/01 0701 - 01/02 0700 In: 1784.9 [P.O.:60; I.V.:1226.3; IV Piggyback:498.7] Out: 540 [Urine:540]  Intake/Output this shift: No intake/output data recorded.  Vent settings for last 24 hours: FiO2 (%):  [70 %] 70 %  Physical Exam:  Vital signs:       Please see the above listed vital signs Patient is awake,  responsive and communicating HEENT:           Trachea midline, no oral lesions appreciated, on facemask oxygen Cardiovascular:            Heart rate has significantly improved in the 90s.  Appears to have intermittent sinus mechanism Pulmonary:      Inspiratory crackles and expiratory rhonchi improved Abdominal:      Hypoactive bowel sounds, not distended, no significant point tenderness to palpation Extremities:     Clubbing, cyanosis or edema noted Neurologic:      Patient moves all extremities, no clear focal deficits noted  Assessment/Plan:  Septic shock.  On meropenem.  Likely etiology is colitis ischemic versus infectious.  Patient is on low-dose Neo-Synephrine.  Hemodynamics have  improved  Respiratory failure.  Patient did well on BiPAP and Precedex, weaned to facemask.  Breathing seems much less labored.  Multifactorial etiology to include bronchospasm, volume status.  Clinically improved  Acute renal failure.    Patient did respond to diuresis yesterday.  Sodium 134, potassium 5, CO2 17 which is stable, BUN 85/creatinine 3.38.  Atrial fibrillation.  Responded well to amiodarone.  Stable ventricular response  Hyponatremia.  134   Leukocytosis.  Secondary to sepsis  Heart failure with reduced ejection fraction  Melena.  No evidence of active bleeding, hemoglobin is stable  Right renal mass.  Will need furthere valuation in the outpatient setting  Critical Care Total Time 40 minutes  Rooney Gladwin 01/24/2018  *Care during the described time interval was provided by me and/or other providers on the critical care team.  I have reviewed this patient's available data, including medical history, events of note, physical examination and test results as part of my evaluation. Patient ID: Glendale Chardeggy T Elbert, female   DOB: 04/04/1940, 78 y.o.   MRN: [id number]

## 2018-01-24 NOTE — Progress Notes (Addendum)
[facility] [place] at [facility]   PATIENT NAME: Christine Mcclain    MR#:  [id number]  DATE OF BIRTH:  11/23/1940  SUBJECTIVE:   Agitated last night , on precedex  Poor urine output. Still on pressor support.  Off BiPAP family at bedside.    REVIEW OF SYSTEMS:  Unobtainable due to patient being drowsy DRUG ALLERGIES:   Allergies  Allergen Reactions  . Gabapentin Hives  . Naproxen Hives  . Penicillins Hives    Has patient had a PCN reaction causing immediate rash, facial/tongue/throat swelling, SOB or lightheadedness with hypotension: No Has patient had a PCN reaction causing severe rash involving mucus membranes or skin necrosis: No Has patient had a PCN reaction that required hospitalization: No Has patient had a PCN reaction occurring within the last 10 years: No If all of the above answers are "NO", then may proceed with Cephalosporin use.  . Codeine Rash    VITALS:  Blood pressure (!) 109/49, pulse 84, temperature 98.3 F (36.8 C), temperature source Oral, resp. rate (!) 22, height 5\' 2"  (1.575 m), weight 60 kg, SpO2 99 %.  PHYSICAL EXAMINATION:  GENERAL:  78 y.o.-year-old patient lying in the bed .  Looks critically ill EYES: Pupils equal, round, reactive to light and accommodation. No scleral icterus. Extraocular muscles intact.  HEENT: Head atraumatic, normocephalic. Oropharynx and nasopharynx clear.  NECK:  Supple, no jugular venous distention. No thyroid enlargement, no tenderness.  LUNGS: Decreased air entry CARDIOVASCULAR: S1, S2 normal. No murmurs, rubs, or gallops.  ABDOMEN: Soft, tender , nondistended. Bowel sounds present.  EXTREMITIES: No pedal edema, cyanosis, or clubbing.  NEUROLOGIC: Drowsy.  Moving all 4 extremities PSYCHIATRIC: The patient is drowsy SKIN: No obvious rash, lesion, or ulcer.   LABORATORY PANEL:   CBC Recent Labs  Lab 01/23/18 0606  WBC 15.6*  HGB 14.0  HCT 43.5  PLT 118*    ------------------------------------------------------------------------------------------------------------------  Chemistries  Recent Labs  Lab 01/22/18 0837  01/24/18 0037  NA 132*   < > 134*  K 5.1   < > 5.0  CL 108   < > 109  CO2 18*   < > 17*  GLUCOSE 107*   < > 169*  BUN 70*   < > 85*  CREATININE 2.82*   < > 3.38*  CALCIUM 8.4*   < > 8.6*  MG 2.1  --   --   AST 205*  --   --   ALT 66*  --   --   ALKPHOS 41  --   --   BILITOT 0.8  --   --    < > = values in this interval not displayed.   ------------------------------------------------------------------------------------------------------------------  Cardiac Enzymes Recent Labs  Lab 01/22/18 0706  TROPONINI 0.12*   ------------------------------------------------------------------------------------------------------------------  RADIOLOGY:  No results found.  EKG:   Orders placed or performed during the hospital encounter of 01/05/2018  . EKG 12-Lead  . EKG 12-Lead  . ED EKG  . ED EKG  . EKG 12-Lead  . EKG 12-Lead  . EKG 12-Lead  . EKG 12-Lead    ASSESSMENT AND PLAN:   # Septic shock Due to colitis ischemic versus infectious and pneumonia. Continue pressor support. On meropenem   #Acute hypoxic respiratory failure secondary to CHF and pneumonia BiPAP weaned to facemask   # Acute ascending colitis Antibiotics changed to IV meropenem No blood in stool.  # Right lower lobe pneumonia. On antibiotics.  Flu negative.  Nebulizers as  needed.  # Acute kidney injury from ATN and IV contrast   Monitor input and output. Creatinine 2.8-3.38 today Discussed with Dr. Wynelle Link of nephrology, patient might need CRRT soon  #Elevated troponin probably demand ischemia patient  No significant increase  #Acute on  Chronic systolic congestive heart failure with previous ejection fraction 30-35% Transfer to ICU and will need diuresis or CRRT per progress  #Right renal mass.  Will need outpatient MRI.   Discussed with patient and family at bedside earlier during admission Unable to get MRI with contrast here due to acute kidney injury  #History of COPD - no exacerbation Bronchodilator treatment Patient needs a CT chest eventually to rule out malignancy for ongoing tobacco abuse.  #Tobacco abuse disorder  counseled on admission  All the records are reviewed and case discussed with Care Management/Social Workerr. Management plans discussed with the patient, family and they are in agreement.  CODE STATUS: FULL CODE  TOTAL TIME TAKING CARE OF THIS PATIENT: 35 minutes  Note: This dictation was prepared with Dragon dictation along with smaller phrase technology. Any transcriptional errors that result from this process are unintentional.  Ramonita Lab M.D on 01/24/2018 at 2:27 PM  Between 7am to 6pm - Pager - [phone]  After 6pm go to [email or web address] - password [email or web address] [facility]  [place]  [facility]  Office  [phone]  CC: Primary care physician; Hillery Aldo, MD

## 2018-01-25 LAB — RENAL FUNCTION PANEL
Albumin: 2.4 g/dL — ABNORMAL LOW (ref 3.5–5.0)
Anion gap: 11 (ref 5–15)
BUN: 96 mg/dL — ABNORMAL HIGH (ref 8–23)
CO2: 17 mmol/L — ABNORMAL LOW (ref 22–32)
Calcium: 8 mg/dL — ABNORMAL LOW (ref 8.9–10.3)
Chloride: 107 mmol/L (ref 98–111)
Creatinine, Ser: 3.44 mg/dL — ABNORMAL HIGH (ref 0.44–1.00)
GFR calc Af Amer: 14 mL/min — ABNORMAL LOW (ref >60)
GFR, EST NON AFRICAN AMERICAN: 12 mL/min — AB (ref >60)
Glucose, Bld: 149 mg/dL — ABNORMAL HIGH (ref 70–99)
Phosphorus: 6.7 mg/dL — ABNORMAL HIGH (ref 2.5–4.6)
Potassium: 5.1 mmol/L (ref 3.5–5.1)
SODIUM: 135 mmol/L (ref 135–145)

## 2018-01-25 LAB — GLUCOSE, CAPILLARY
Glucose-Capillary: 118 mg/dL — ABNORMAL HIGH (ref 70–99)
Glucose-Capillary: 123 mg/dL — ABNORMAL HIGH (ref 70–99)
Glucose-Capillary: 130 mg/dL — ABNORMAL HIGH (ref 70–99)
Glucose-Capillary: 131 mg/dL — ABNORMAL HIGH (ref 70–99)
Glucose-Capillary: 133 mg/dL — ABNORMAL HIGH (ref 70–99)
Glucose-Capillary: 133 mg/dL — ABNORMAL HIGH (ref 70–99)
Glucose-Capillary: 143 mg/dL — ABNORMAL HIGH (ref 70–99)

## 2018-01-25 MED ORDER — BOOST / RESOURCE BREEZE PO LIQD CUSTOM
1.0000 | Freq: Three times a day (TID) | ORAL | Status: DC
  Administered 2018-01-25 – 2018-01-28 (×6): 1 via ORAL

## 2018-01-25 NOTE — Consult Note (Signed)
Pharmacy Antibiotic Note  Christine Mcclain is a 78 y.o. female admitted on 01/02/2018 with Colitis. She was started on Cipro/Flagyl on admission. 12/31 pharmacy consulted for meropenem dosing for intra-abdominal infection.   Plan: Continue meropenem 500mg  every 12 hours.   Height: 5\' 2"  (157.5 cm) Weight: 130 lb 8.2 oz (59.2 kg) IBW/kg (Calculated) : 50.1  Temp (24hrs), Avg:98 F (36.7 C), Min:97.2 F (36.2 C), Max:98.6 F (37 C)  Recent Labs  Lab 01/19/18 0506  01/21/18 0532  01/22/18 0837 01/22/18 1052 01/23/18 0606 01/23/18 1909 01/24/18 0037 01/25/18 1000  WBC 9.6  --  13.9*  --  15.4* 14.5* 15.6*  --   --   --   CREATININE 1.41*   < > 2.21*   < > 2.82*  --  3.05* 3.36* 3.38* 3.44*  LATICACIDVEN  --   --   --   --  1.4 1.2  --   --   --   --    < > = values in this interval not displayed.    Estimated Creatinine Clearance: 10.5 mL/min (A) (by C-G formula based on SCr of 3.44 mg/dL (H)).    Allergies  Allergen Reactions  . Gabapentin Hives  . Naproxen Hives  . Penicillins Hives    Has patient had a PCN reaction causing immediate rash, facial/tongue/throat swelling, SOB or lightheadedness with hypotension: No Has patient had a PCN reaction causing severe rash involving mucus membranes or skin necrosis: No Has patient had a PCN reaction that required hospitalization: No Has patient had a PCN reaction occurring within the last 10 years: No If all of the above answers are "NO", then may proceed with Cephalosporin use.  . Codeine Rash    Antimicrobials this admission: 12/26 Flagyl  >>  12/31  12/27 Cipro  >> 12/31  12/31 meropenem >>    Microbiology results: 12/27: Resp Panel PCR (-)  12/31 MRSA PCR (-)   Thank you for allowing pharmacy to be a part of this patient's care.  Gardner Candle, PharmD, BCPS Clinical Pharmacist 01/25/2018 2:05 PM

## 2018-01-25 NOTE — Progress Notes (Signed)
Follow up - Critical Care Medicine Note  Patient Details:    Christine Mcclain is an 78 y.o. female.with a past medical history remarkable for hypertension, hyperlipidemia, COPD, asthma, arthritis, was admitted on 12/26 after developing left lower quadrant pain associated with nausea.  She also was passing black stool.  Was admitted and treated for colitis, fluid resuscitation, antibiotics with Cipro and Flagyl.  She was also noted during hospitalization to have a right lower lobe pneumonia, possible melena, acute renal injury, elevated troponin, chronic systolic heart failure with an ejection fraction of 30%, right renal mass being followed.  Rapid response secondary to decreasing blood pressure and loss of IV access. Patient is also having some increasing shortness of breath and is being moved to the intensive care unit for further therapy   Lines, Airways, Drains: CVC Triple Lumen 01/22/18 Right Femoral 20 cm (Active)  Indication for Insertion or Continuance of Line Vasoactive infusions 01/22/2018  8:10 PM  Site Assessment Clean;Dry;Intact 01/22/2018  8:10 PM  Proximal Lumen Status Flushed;Saline locked 01/22/2018  8:10 PM  Medial Lumen Status Infusing;Flushed 01/22/2018  8:10 PM  Distal Lumen Status Flushed;Saline locked 01/22/2018  8:10 PM  Dressing Type Transparent;Occlusive 01/22/2018  8:10 PM  Dressing Status Clean;Dry;Intact;Antimicrobial disc in place 01/22/2018  8:10 PM  Line Care Connections checked and tightened 01/22/2018  8:10 PM  Dressing Intervention New dressing 01/22/2018 11:00 AM  Dressing Change Due 01/29/18 01/22/2018  8:10 PM     Urethral Catheter C Green, RN Double-lumen;Latex 14 Fr. (Active)  Indication for Insertion or Continuance of Catheter Unstable critical patients (first 24-48 hours) 01/23/2018  5:17 AM  Site Assessment Clean;Intact 01/23/2018  5:17 AM  Date Prophylactic Dressing Applied (if applicable) 01/22/18 01/22/2018 11:00 AM  Catheter Maintenance Bag below level  of bladder 01/23/2018  5:17 AM  Collection Container Standard drainage bag 01/23/2018  5:17 AM  Securement Method Securing device (Describe) 01/23/2018  5:17 AM  Output (mL) 315 mL 01/22/2018  4:00 PM    Anti-infectives:  Anti-infectives (From admission, onward)   Start     Dose/Rate Route Frequency Ordered Stop   01/22/18 1130  meropenem (MERREM) 500 mg in sodium chloride 0.9 % 100 mL IVPB     500 mg 200 mL/hr over 30 Minutes Intravenous Every 12 hours 01/22/18 1129     01/19/18 1700  ciprofloxacin (CIPRO) tablet 500 mg  Status:  Discontinued     500 mg Oral Daily with supper 01/19/18 1331 01/22/18 1058   01/19/18 0600  metroNIDAZOLE (FLAGYL) tablet 500 mg  Status:  Discontinued     500 mg Oral Every 8 hours 01/18/18 1442 01/22/18 1058   01/18/18 2000  ciprofloxacin (CIPRO) tablet 250 mg  Status:  Discontinued     250 mg Oral 2 times daily 01/18/18 1442 01/18/18 1628   01/18/18 1800  ciprofloxacin (CIPRO) IVPB 400 mg  Status:  Discontinued     400 mg 200 mL/hr over 60 Minutes Intravenous Every 24 hours 01/18/18 1628 01/19/18 1331   01/18/18 0000  levofloxacin (LEVAQUIN) IVPB 750 mg  Status:  Discontinued     750 mg 100 mL/hr over 90 Minutes Intravenous  Once 01/22/2018 1159 01/06/2018 1159   01/04/2018 1200  ciprofloxacin (CIPRO) IVPB 400 mg  Status:  Discontinued     400 mg 200 mL/hr over 60 Minutes Intravenous Every 12 hours 01/01/2018 1158 01/18/2018 1159   01/13/2018 1200  metroNIDAZOLE (FLAGYL) IVPB 500 mg  Status:  Discontinued     500 mg 100  mL/hr over 60 Minutes Intravenous Every 8 hours 01/22/2018 1158 12/29/2017 1159   12/27/2017 1200  metroNIDAZOLE (FLAGYL) IVPB 500 mg     500 mg 100 mL/hr over 60 Minutes Intravenous Every 8 hours 12/29/2017 1159 01/18/18 2318   12/23/2017 1130  levofloxacin (LEVAQUIN) IVPB 750 mg     750 mg 100 mL/hr over 90 Minutes Intravenous  Once 01/20/2018 1116 01/03/2018 1336      Microbiology: Results for orders placed or performed during the hospital encounter of  01/20/2018  Respiratory Panel by PCR     Status: None   Collection Time: 01/18/18 10:28 AM  Result Value Ref Range Status   Adenovirus NOT DETECTED NOT DETECTED Final   Coronavirus 229E NOT DETECTED NOT DETECTED Final   Coronavirus HKU1 NOT DETECTED NOT DETECTED Final   Coronavirus NL63 NOT DETECTED NOT DETECTED Final   Coronavirus OC43 NOT DETECTED NOT DETECTED Final   Metapneumovirus NOT DETECTED NOT DETECTED Final   Rhinovirus / Enterovirus NOT DETECTED NOT DETECTED Final   Influenza A NOT DETECTED NOT DETECTED Final   Influenza B NOT DETECTED NOT DETECTED Final   Parainfluenza Virus 1 NOT DETECTED NOT DETECTED Final   Parainfluenza Virus 2 NOT DETECTED NOT DETECTED Final   Parainfluenza Virus 3 NOT DETECTED NOT DETECTED Final   Parainfluenza Virus 4 NOT DETECTED NOT DETECTED Final   Respiratory Syncytial Virus NOT DETECTED NOT DETECTED Final   Bordetella pertussis NOT DETECTED NOT DETECTED Final   Chlamydophila pneumoniae NOT DETECTED NOT DETECTED Final   Mycoplasma pneumoniae NOT DETECTED NOT DETECTED Final    Comment: Performed at [facility], [address] [place], [place] [address]  MRSA PCR Screening     Status: None   Collection Time: 01/22/18 10:52 AM  Result Value Ref Range Status   MRSA by PCR NEGATIVE NEGATIVE Final    Comment:        The GeneXpert MRSA Assay (FDA approved for NASAL specimens only), is one component of a comprehensive MRSA colonization surveillance program. It is not intended to diagnose MRSA infection nor to guide or monitor treatment for MRSA infections. Performed at [facility], [address] [place], [place] [address]    Studies: Dg Chest 2 View  Result Date: 01/21/2018 CLINICAL DATA:  Hypoxia. EXAM: CHEST - 2 VIEW COMPARISON:  Chest x-ray 8 01/09/2018.  CT 01/20/2018, 09/05/2017. FINDINGS: Mediastinum and hilar structures are normal. Heart size stable. Left lower lobe atelectasis/consolidation with left-sided pleural  effusion. Small right pleural effusion. Elevation left hemidiaphragm again noted. No pneumothorax contrast noted over the colon. Degenerative change and osteopenia thoracic spine. IMPRESSION: 1. Left lower lobe atelectasis/consolidation with left-sided pleural effusion. Small right pleural effusion. These findings are new from prior exam. 2.  Elevation left hemidiaphragm again noted. Electronically Signed   By: Maisie Fushomas  Register   On: 01/21/2018 13:01   Dg Chest 2 View  Result Date: 01/11/2018 CLINICAL DATA:  Left lower quadrant pain. EXAM: CHEST - 2 VIEW COMPARISON:  Eight/14/19 FINDINGS: Normal heart size. Aortic atherosclerosis. No pleural effusion or edema. No airspace opacities. Asymmetric elevation of left hemidiaphragm is again noted. No acute bone abnormalities noted. Spondylosis identified within the thoracic spine. IMPRESSION: No active cardiopulmonary disease. Aortic Atherosclerosis (ICD10-I70.0). Electronically Signed   By: Signa Kellaylor  Stroud M.D.   On: 12/23/2017 09:14   Ct Abdomen Pelvis W Contrast  Result Date: 12/23/2017 CLINICAL DATA:  LEFT lower quadrant pain. Nausea. PET-CT 02/16/2016 EXAM: CT ABDOMEN AND PELVIS WITH CONTRAST TECHNIQUE: Multidetector  CT imaging of the abdomen and pelvis was performed using the standard protocol following bolus administration of intravenous contrast. CONTRAST:  40mL OMNIPAQUE IOHEXOL 300 MG/ML  SOLN COMPARISON:  PET-CT 02/16/2016, CT chest 09/05/2017 FINDINGS: Lower chest: Mild nodular airspace disease in the RIGHT lower lobe is new from comparison CT 09/05/2017 Hepatobiliary: No focal hepatic lesion. Mild periportal edema. Postcholecystectomy. Pancreas: Pancreas is normal. No ductal dilatation. No pancreatic inflammation. Spleen: Normal spleen Adrenals/urinary tract: Adrenal glands normal. Benign cyst of the LEFT kidney. No renal obstruction. Exophytic from the lower pole of the RIGHT kidney there is a ovoid high-density mass measuring 2.7 cm with HU equal  70. High-density lesion on comparison noncontrast PET-CT scan of 02/16/2016 at equal density; however, lesion was smaller measuring 2.2 by 1.8 cm. Lesion had no significant metabolic activity at that time. No renal obstruction. There is moderate volume of gas within the bladder collecting non dependently (image 62/6). Stomach/Bowel: Stomach, small-bowel normal. Post appendectomy. There is mild thickening of the ascending colon. Small amount fluid surrounding the ascending colon. Transverse descending colon appear normal. Rectosigmoid colon normal. Vascular/Lymphatic: Abdominal aorta is normal caliber with atherosclerotic calcification. There is no retroperitoneal or periportal lymphadenopathy. No pelvic lymphadenopathy. Reproductive: Post hysterectomy Other: Moderate amount of simple fluid the posterior cul-de-sac. Musculoskeletal: no aggressive osseous lesion. Severe degenerate changes of the lower lumbar spine. IMPRESSION: 1. New nodular airspace disease in the RIGHT lower lobe could represent aspiration pneumonitis or pneumonia. 2. Mild thickening of the ascending colon suggests segmental colitis. Differential would include inflammatory bowel disease, infectious colitis, drug induced colitis, and ischemic colitis. 3. Moderate volume of gas within the bladder. Recommend correlation with instrumentation. 4. High-density mass exophytic from the lower pole of the RIGHT kidney is indeterminate. Recommend MRI with and without contrast for further evaluation. Electronically Signed   By: Genevive Bi M.D.   On: 01/11/2018 10:51   Dg Chest Port 1 View  Result Date: 01/22/2018 CLINICAL DATA:  Shortness of breath.  Current smoker.  Hypertension. EXAM: PORTABLE CHEST 1 VIEW COMPARISON:  01/21/2018. FINDINGS: LEFT lower lobe atelectasis and consolidation with LEFT pleural effusion is redemonstrated. Unchanged cardiomediastinal silhouette. Calcified tortuous aorta. Mild vascular congestion. IMPRESSION: No active  disease. Electronically Signed   By: Elsie Stain M.D.   On: 01/22/2018 07:18   [place] Ekg Site Rite  Result Date: 01/22/2018 If Site Rite image not attached, placement could not be confirmed due to current cardiac rhythm.   Consults: Treatment Team:  Lamont Dowdy, MD   Subjective:    Overnight Issues: Patient resting very comfortably today.  Has been taken off of BiPAP, presently on supplemental oxygen with stable saturations.  Bronchospasm resolved.  Made 1235 cc urine.  Still on low-dose pressors but improving  Objective:  Vital signs for last 24 hours: Temp:  [97.9 F (36.6 C)-98.6 F (37 C)] 97.9 F (36.6 C) (01/03 0800) Pulse Rate:  [76-101] 93 (01/03 0800) Resp:  [14-27] 21 (01/03 0800) BP: (76-111)/(44-73) 102/53 (01/03 0800) SpO2:  [77 %-100 %] 97 % (01/03 0800) Weight:  [59.2 kg] 59.2 kg (01/03 0500)  Hemodynamic parameters for last 24 hours:    Intake/Output from previous day: 01/02 0701 - 01/03 0700 In: 1154.8 [I.V.:853.5; IV Piggyback:301.4] Out: 1235 [Urine:1235]  Intake/Output this shift: Total I/O In: 48.3 [I.V.:48.3] Out: -   Vent settings for last 24 hours:    Physical Exam:  Vital signs:       Please see the above listed vital signs Patient is awake, responsive  and communicating HEENT:           Trachea midline, no oral lesions appreciated, on facemask oxygen Cardiovascular:            Heart rate has significantly improved in the 90s.  Appears to have intermittent sinus mechanism Pulmonary:      Inspiratory crackles and expiratory rhonchi improved Abdominal:      Hypoactive bowel sounds, not distended, no significant point tenderness to palpation Extremities:     Clubbing, cyanosis or edema noted Neurologic:      Patient moves all extremities, no clear focal deficits noted  Assessment/Plan:  Septic shock.  On meropenem.  Likely etiology is colitis ischemic versus infectious.  Patient is on low-dose Neo-Synephrine.  Hemodynamics have  improved  Respiratory failure.  Patient did well on BiPAP and Precedex, weaned to facemask.  Breathing seems much less labored.  Multifactorial etiology to include bronchospasm, volume status.  Clinically improved  Acute renal failure.    Patient did respond to diuresis yesterday.  Sodium 134, potassium 5, CO2 17 which is stable, BUN 96/creatinine 3.44.  Greatly appreciate nephrology's assistance with care  Atrial fibrillation.  Responded well to amiodarone.  Stable ventricular response  Leukocytosis.    Will repeat CBC  Heart failure with reduced ejection fraction  Right renal mass.  Will need furthere valuation in the outpatient setting  Critical Care Total Time 30 minutes  Malashia Kamaka 01/25/2018  *Care during the described time interval was provided by me and/or other providers on the critical care team.  I have reviewed this patient's available data, including medical history, events of note, physical examination and test results as part of my evaluation. Patient ID: YOCELIN PLACK, female   DOB: 1940/09/04, 78 y.o.   MRN: [id number] Patient ID: TOMEEKA BORTZ, female   DOB: [birth year], 78 y.o.   MRN: [id number]

## 2018-01-25 NOTE — Progress Notes (Signed)
[facility]  ROUNDING NOTE   Subjective:   UOP  Family at bedside  Phenylephrine gtt.  amiodarone  Objective:  Vital signs in last 24 hours:  Temp:  [97.9 F (36.6 C)-98.6 F (37 C)] 97.9 F (36.6 C) (01/03 0800) Pulse Rate:  [76-101] 93 (01/03 0800) Resp:  [14-27] 21 (01/03 0800) BP: (76-111)/(44-73) 102/53 (01/03 0800) SpO2:  [77 %-100 %] 97 % (01/03 0800) Weight:  [59.2 kg] 59.2 kg (01/03 0500)  Weight change: -0.8 kg Filed Weights   01/22/18 1100 01/24/18 0500 01/25/18 0500  Weight: 56.3 kg 60 kg 59.2 kg    Intake/Output: I/O last 3 completed shifts: In: 1793 [I.V.:1293; IV Piggyback:500] Out: 1625 [Urine:1625]   Intake/Output this shift:  Total I/O In: 48.3 [I.V.:48.3] Out: -   Physical Exam: General: Critically ill,   Head: [place]/AT +thrush  Eyes: Anicteric, PERRL  Neck: Supple, trachea midline  Lungs:  Clear, 3.5L [place]  Heart: irregular  Abdomen:  + mildly tender  Extremities:  no peripheral edema.  Neurologic: Nonfocal, moving all four extremities  Skin: No lesions        Basic Metabolic Panel: Recent Labs  Lab 01/22/18 0837 01/23/18 0606 01/23/18 1909 01/24/18 0037 01/25/18 1000  NA 132* 134* 134* 134* 135  K 5.1 5.4* 5.3* 5.0 5.1  CL 108 109 110 109 107  CO2 18* 16* 16* 17* 17*  GLUCOSE 107* 112* 139* 169* 149*  BUN 70* 78* 83* 85* 96*  CREATININE 2.82* 3.05* 3.36* 3.38* 3.44*  CALCIUM 8.4* 8.7* 8.6* 8.6* 8.0*  MG 2.1  --   --   --   --   PHOS 6.4*  --   --   --  6.7*    Liver Function Tests: Recent Labs  Lab 01/22/18 0837 01/25/18 1000  AST 205*  --   ALT 66*  --   ALKPHOS 41  --   BILITOT 0.8  --   PROT 4.4*  --   ALBUMIN 1.9* 2.4*   No results for input(s): LIPASE, AMYLASE in the last 168 hours. No results for input(s): AMMONIA in the last 168 hours.  CBC: Recent Labs  Lab 01/19/18 0506 01/21/18 0532 01/22/18 0837 01/22/18 1052 01/23/18 0606  WBC 9.6 13.9* 15.4* 14.5* 15.6*  NEUTROABS  --   11.5* 12.9* 12.3*  --   HGB 13.5 14.5 12.8 13.6 14.0  HCT 41.0 45.1 39.5 42.0 43.5  MCV 100.5* 101.8* 101.8* 101.7* 102.1*  PLT PLATELET CLUMPS NOTED ON SMEAR, UNABLE TO ESTIMATE PLATELET CLUMPS NOTED ON SMEAR, UNABLE TO ESTIMATE 130* 119* 118*    Cardiac Enzymes: Recent Labs  Lab 01/22/18 0706  TROPONINI 0.12*    BNP: Invalid input(s): POCBNP  CBG: Recent Labs  Lab 01/24/18 1633 01/24/18 1946 01/24/18 2348 01/25/18 0458 01/25/18 0750  GLUCAP 131* 125* 143* 133* 118*    Microbiology: Results for orders placed or performed during the hospital encounter of 01/16/2018  Respiratory Panel by PCR     Status: None   Collection Time: 01/18/18 10:28 AM  Result Value Ref Range Status   Adenovirus NOT DETECTED NOT DETECTED Final   Coronavirus 229E NOT DETECTED NOT DETECTED Final   Coronavirus HKU1 NOT DETECTED NOT DETECTED Final   Coronavirus NL63 NOT DETECTED NOT DETECTED Final   Coronavirus OC43 NOT DETECTED NOT DETECTED Final   Metapneumovirus NOT DETECTED NOT DETECTED Final   Rhinovirus / Enterovirus NOT DETECTED NOT DETECTED Final   Influenza A NOT DETECTED NOT DETECTED Final  Influenza B NOT DETECTED NOT DETECTED Final   Parainfluenza Virus 1 NOT DETECTED NOT DETECTED Final   Parainfluenza Virus 2 NOT DETECTED NOT DETECTED Final   Parainfluenza Virus 3 NOT DETECTED NOT DETECTED Final   Parainfluenza Virus 4 NOT DETECTED NOT DETECTED Final   Respiratory Syncytial Virus NOT DETECTED NOT DETECTED Final   Bordetella pertussis NOT DETECTED NOT DETECTED Final   Chlamydophila pneumoniae NOT DETECTED NOT DETECTED Final   Mycoplasma pneumoniae NOT DETECTED NOT DETECTED Final    Comment: Performed at [facility], [address] [place], [place] [address]  MRSA PCR Screening     Status: None   Collection Time: 01/22/18 10:52 AM  Result Value Ref Range Status   MRSA by PCR NEGATIVE NEGATIVE Final    Comment:        The GeneXpert MRSA Assay (FDA approved for NASAL  specimens only), is one component of a comprehensive MRSA colonization surveillance program. It is not intended to diagnose MRSA infection nor to guide or monitor treatment for MRSA infections. Performed at [facility], [address] [place], [place] [address]     Coagulation Studies: No results for input(s): LABPROT, INR in the last 72 hours.  Urinalysis: No results for input(s): COLORURINE, LABSPEC, PHURINE, GLUCOSEU, HGBUR, BILIRUBINUR, KETONESUR, PROTEINUR, UROBILINOGEN, NITRITE, LEUKOCYTESUR in the last 72 hours.  Invalid input(s): APPERANCEUR    Imaging: No results found.   Medications:   . amiodarone 30 mg/hr (01/25/18 0800)  . dexmedetomidine (PRECEDEX) IV infusion Stopped (01/25/18 0552)  . meropenem (MERREM) IV 500 mg (01/25/18 0953)  . phenylephrine (NEO-SYNEPHRINE) Adult infusion 20 mcg/min (01/25/18 0800)   . budesonide (PULMICORT) nebulizer solution  0.5 mg Nebulization BID  . feeding supplement (ENSURE ENLIVE)  237 mL Oral BID BM  . ipratropium-albuterol  3 mL Nebulization Q6H  . magic mouthwash  5 mL Oral QID   And  . lidocaine  5 mL Mouth/Throat TID  . mouth rinse  15 mL Mouth Rinse BID  . methylPREDNISolone (SOLU-MEDROL) injection  60 mg Intravenous Q6H  . nystatin  5 mL Oral QID  . pantoprazole (PROTONIX) IV  40 mg Intravenous Q24H  . sodium chloride flush  10-40 mL Intracatheter Q12H  . vitamin C  250 mg Oral BID   acetaminophen **OR** acetaminophen, bisacodyl, HYDROcodone-acetaminophen, ipratropium-albuterol, ondansetron **OR** ondansetron (ZOFRAN) IV, phenol, sodium chloride flush, traZODone  Assessment/ Plan:  Ms. Glendale Chardeggy T Weingartner is a 78 y.o. white female with COPD, hypertension, hyperlipidemia, osteoarthritis, osteopenia, systolic congestive heart failure EF 30% who was admitted to [facility] on 01/07/2018 for colitis, pneumonia and acute renal failure  1. Acute kidney failure on chronic kidney disease stage III: baseline creatinine of  1.18, GFR of 43 on 08/15/17.  2. Hyponatremia 3. Hyperkalemia 4. Metabolic acidosis 5. Hematuria 6. Renal Mass right - incidental finding.   Plan:  Chronic kidney disease secondary to hypertension Acute renal failure secondary to IV contrast exposure, prerenal azotemia and hypotension  - holding naproxen.  - Needs outpatient follow up on renal mass.  - Discussed with family about overall prognosis. Husband states that patient would want dialysis if indicated.   - BUN elevated. Hold furosemide   LOS: 8 Ariya Bohannon 1/3/202011:12 AM

## 2018-01-25 NOTE — Progress Notes (Signed)
[place] [facility] [place] at [facility]   PATIENT NAME: Christine Mcclain    MR#:  [id number]  DATE OF BIRTH:  01/14/1941  SUBJECTIVE:   Improved urine output 1235 cc, Off BiPAP family at bedside.    REVIEW OF SYSTEMS:  Unobtainable due to patient being drowsy DRUG ALLERGIES:   Allergies  Allergen Reactions  . Gabapentin Hives  . Naproxen Hives  . Penicillins Hives    Has patient had a PCN reaction causing immediate rash, facial/tongue/throat swelling, SOB or lightheadedness with hypotension: No Has patient had a PCN reaction causing severe rash involving mucus membranes or skin necrosis: No Has patient had a PCN reaction that required hospitalization: No Has patient had a PCN reaction occurring within the last 10 years: No If all of the above answers are "NO", then may proceed with Cephalosporin use.  . Codeine Rash    VITALS:  Blood pressure (!) 112/58, pulse 96, temperature (!) 97.2 F (36.2 C), temperature source Axillary, resp. rate (!) 29, height 5\' 2"  (1.575 m), weight 59.2 kg, SpO2 93 %.  PHYSICAL EXAMINATION:  GENERAL:  78 y.o.-year-old patient lying in the bed .  Looks critically ill EYES: Pupils equal, round, reactive to light and accommodation. No scleral icterus. Extraocular muscles intact.  HEENT: Head atraumatic, normocephalic. Oropharynx and nasopharynx clear.  NECK:  Supple, no jugular venous distention. No thyroid enlargement, no tenderness.  LUNGS: Decreased air entry CARDIOVASCULAR: S1, S2 normal. No murmurs, rubs, or gallops.  ABDOMEN: Soft, tender , nondistended. Bowel sounds present.  EXTREMITIES: No pedal edema, cyanosis, or clubbing.  NEUROLOGIC: Drowsy.  Moving all 4 extremities PSYCHIATRIC: The patient is drowsy SKIN: No obvious rash, lesion, or ulcer.   LABORATORY PANEL:   CBC Recent Labs  Lab 01/23/18 0606  WBC 15.6*  HGB 14.0  HCT 43.5  PLT 118*    ------------------------------------------------------------------------------------------------------------------  Chemistries  Recent Labs  Lab 01/22/18 0837  01/25/18 1000  NA 132*   < > 135  K 5.1   < > 5.1  CL 108   < > 107  CO2 18*   < > 17*  GLUCOSE 107*   < > 149*  BUN 70*   < > 96*  CREATININE 2.82*   < > 3.44*  CALCIUM 8.4*   < > 8.0*  MG 2.1  --   --   AST 205*  --   --   ALT 66*  --   --   ALKPHOS 41  --   --   BILITOT 0.8  --   --    < > = values in this interval not displayed.   ------------------------------------------------------------------------------------------------------------------  Cardiac Enzymes Recent Labs  Lab 01/22/18 0706  TROPONINI 0.12*   ------------------------------------------------------------------------------------------------------------------  RADIOLOGY:  No results found.  EKG:   Orders placed or performed during the hospital encounter of 22-Sep-2017  . EKG 12-Lead  . EKG 12-Lead  . ED EKG  . ED EKG  . EKG 12-Lead  . EKG 12-Lead  . EKG 12-Lead  . EKG 12-Lead    ASSESSMENT AND PLAN:   # Septic shock Due to colitis ischemic versus infectious and pneumonia. Continue pressor support with Neo-Synephrine On meropenem   #Acute hypoxic respiratory failure secondary to CHF and pneumonia BiPAP weaned to facemask   # Acute ascending colitis Antibiotics  IV meropenem No blood in stool.  # Right lower lobe pneumonia. On antibiotics.  Flu negative.  Nebulizers as needed.  # Acute kidney injury from ATN  2/2 IV contrast   Monitor input and output. Creatinine 2.8-3.38-3.44 today Discontinue diuretics Discussed with Dr. Wynelle Link of nephrology, patient might need CRRT soon Family is agreeable  #Elevated troponin probably demand ischemia patient  No significant increase  #Acute on  Chronic systolic congestive heart failure with previous ejection fraction 30-35% Transfer to ICU and will need diuresis or CRRT per  progress  #Right renal mass.  Will need outpatient MRI.  Discussed with patient and family at bedside earlier during admission Unable to get MRI with contrast here due to acute kidney injury  #History of COPD - no exacerbation Bronchodilator treatment Patient needs a CT chest eventually to rule out malignancy for ongoing tobacco abuse.  #Tobacco abuse disorder  counseled on admission  All the records are reviewed and case discussed with Care Management/Social Workerr. Management plans discussed with the patient, family and they are in agreement.  CODE STATUS: FULL CODE  TOTAL TIME TAKING CARE OF THIS PATIENT: 35 minutes  Note: This dictation was prepared with Dragon dictation along with smaller phrase technology. Any transcriptional errors that result from this process are unintentional.  Ramonita Lab M.D on 01/25/2018 at 2:41 PM  Between 7am to 6pm - Pager - [phone]  After 6pm go to [email or web address] - password [email or web address] [facility]  [place] [facility] [facility]  Office  [phone]  CC: Primary care physician; Hillery Aldo, MD

## 2018-01-26 ENCOUNTER — Inpatient Hospital Stay: Payer: Medicare Other

## 2018-01-26 LAB — RENAL FUNCTION PANEL
Albumin: 2.6 g/dL — ABNORMAL LOW (ref 3.5–5.0)
Anion gap: 9 (ref 5–15)
BUN: 100 mg/dL — ABNORMAL HIGH (ref 8–23)
CO2: 17 mmol/L — ABNORMAL LOW (ref 22–32)
Calcium: 7.8 mg/dL — ABNORMAL LOW (ref 8.9–10.3)
Chloride: 108 mmol/L (ref 98–111)
Creatinine, Ser: 3.45 mg/dL — ABNORMAL HIGH (ref 0.44–1.00)
GFR calc Af Amer: 14 mL/min — ABNORMAL LOW (ref >60)
GFR calc non Af Amer: 12 mL/min — ABNORMAL LOW (ref >60)
Glucose, Bld: 151 mg/dL — ABNORMAL HIGH (ref 70–99)
Phosphorus: 6.5 mg/dL — ABNORMAL HIGH (ref 2.5–4.6)
Potassium: 5.1 mmol/L (ref 3.5–5.1)
Sodium: 134 mmol/L — ABNORMAL LOW (ref 135–145)

## 2018-01-26 LAB — CBC WITH DIFFERENTIAL/PLATELET
Abs Immature Granulocytes: 0.27 10*3/uL — ABNORMAL HIGH (ref 0.00–0.07)
Basophils Absolute: 0 10*3/uL (ref 0.0–0.1)
Basophils Relative: 0 %
Eosinophils Absolute: 0 10*3/uL (ref 0.0–0.5)
Eosinophils Relative: 0 %
HCT: 39.5 % (ref 36.0–46.0)
Hemoglobin: 13.3 g/dL (ref 12.0–15.0)
Immature Granulocytes: 1 %
Lymphocytes Relative: 1 %
Lymphs Abs: 0.2 10*3/uL — ABNORMAL LOW (ref 0.7–4.0)
MCH: 32.6 pg (ref 26.0–34.0)
MCHC: 33.7 g/dL (ref 30.0–36.0)
MCV: 96.8 fL (ref 80.0–100.0)
Monocytes Absolute: 0.9 10*3/uL (ref 0.1–1.0)
Monocytes Relative: 4 %
Neutro Abs: 20.9 10*3/uL — ABNORMAL HIGH (ref 1.7–7.7)
Neutrophils Relative %: 94 %
Platelets: 115 10*3/uL — ABNORMAL LOW (ref 150–400)
RBC: 4.08 MIL/uL (ref 3.87–5.11)
RDW: 15.4 % (ref 11.5–15.5)
Smear Review: UNDETERMINED
WBC: 22.3 10*3/uL — ABNORMAL HIGH (ref 4.0–10.5)
nRBC: 0 % (ref 0.0–0.2)

## 2018-01-26 LAB — GLUCOSE, CAPILLARY
Glucose-Capillary: 122 mg/dL — ABNORMAL HIGH (ref 70–99)
Glucose-Capillary: 113 mg/dL — ABNORMAL HIGH (ref 70–99)
Glucose-Capillary: 125 mg/dL — ABNORMAL HIGH (ref 70–99)
Glucose-Capillary: 177 mg/dL — ABNORMAL HIGH (ref 70–99)
Glucose-Capillary: 155 mg/dL — ABNORMAL HIGH (ref 70–99)
Glucose-Capillary: 196 mg/dL — ABNORMAL HIGH (ref 70–99)

## 2018-01-26 MED ORDER — LORAZEPAM 2 MG/ML IJ SOLN
1.0000 mg | Freq: Once | INTRAMUSCULAR | Status: AC
  Administered 2018-01-26: 1 mg via INTRAVENOUS

## 2018-01-26 MED ORDER — LORAZEPAM 2 MG/ML IJ SOLN
INTRAMUSCULAR | Status: AC
  Filled 2018-01-26: qty 1

## 2018-01-26 MED ORDER — FUROSEMIDE 10 MG/ML IJ SOLN
40.0000 mg | Freq: Every day | INTRAMUSCULAR | Status: DC
  Administered 2018-01-26 – 2018-01-29 (×4): 40 mg via INTRAVENOUS
  Filled 2018-01-26 (×4): qty 4

## 2018-01-26 MED ORDER — METHYLPREDNISOLONE SODIUM SUCC 125 MG IJ SOLR
60.0000 mg | Freq: Two times a day (BID) | INTRAMUSCULAR | Status: DC
  Administered 2018-01-26 – 2018-01-28 (×4): 60 mg via INTRAVENOUS
  Filled 2018-01-26 (×4): qty 2

## 2018-01-26 MED ORDER — JEVITY 1.2 CAL PO LIQD
1000.0000 mL | ORAL | Status: DC
  Administered 2018-01-26: 1000 mL

## 2018-01-26 MED ORDER — MORPHINE SULFATE (PF) 2 MG/ML IV SOLN
1.0000 mg | INTRAVENOUS | Status: DC | PRN
  Administered 2018-01-28: 1 mg via INTRAVENOUS
  Filled 2018-01-26: qty 1

## 2018-01-26 NOTE — Progress Notes (Signed)
Follow up - Critical Care Medicine Note  Patient Details:    Christine Mcclain is an 78 y.o. female.with a past medical history remarkable for hypertension, hyperlipidemia, COPD, asthma, arthritis, was admitted on 12/26 after developing left lower quadrant pain associated with nausea.  She also was passing black stool.  Was admitted and treated for colitis, fluid resuscitation, antibiotics with Cipro and Flagyl.  She was also noted during hospitalization to have a right lower lobe pneumonia, possible melena, acute renal injury, elevated troponin, chronic systolic heart failure with an ejection fraction of 30%, right renal mass being followed.  Rapid response secondary to decreasing blood pressure and loss of IV access. Patient is also having some increasing shortness of breath and is being moved to the intensive care unit for further therapy   Lines, Airways, Drains: CVC Triple Lumen 01/22/18 Right Femoral 20 cm (Active)  Indication for Insertion or Continuance of Line Vasoactive infusions 01/22/2018  8:10 PM  Site Assessment Clean;Dry;Intact 01/22/2018  8:10 PM  Proximal Lumen Status Flushed;Saline locked 01/22/2018  8:10 PM  Medial Lumen Status Infusing;Flushed 01/22/2018  8:10 PM  Distal Lumen Status Flushed;Saline locked 01/22/2018  8:10 PM  Dressing Type Transparent;Occlusive 01/22/2018  8:10 PM  Dressing Status Clean;Dry;Intact;Antimicrobial disc in place 01/22/2018  8:10 PM  Line Care Connections checked and tightened 01/22/2018  8:10 PM  Dressing Intervention New dressing 01/22/2018 11:00 AM  Dressing Change Due 01/29/18 01/22/2018  8:10 PM     Urethral Catheter C Green, RN Double-lumen;Latex 14 Fr. (Active)  Indication for Insertion or Continuance of Catheter Unstable critical patients (first 24-48 hours) 01/23/2018  5:17 AM  Site Assessment Clean;Intact 01/23/2018  5:17 AM  Date Prophylactic Dressing Applied (if applicable) 01/22/18 01/22/2018 11:00 AM  Catheter Maintenance Bag below level  of bladder 01/23/2018  5:17 AM  Collection Container Standard drainage bag 01/23/2018  5:17 AM  Securement Method Securing device (Describe) 01/23/2018  5:17 AM  Output (mL) 315 mL 01/22/2018  4:00 PM    Anti-infectives:  Anti-infectives (From admission, onward)   Start     Dose/Rate Route Frequency Ordered Stop   01/22/18 1130  meropenem (MERREM) 500 mg in sodium chloride 0.9 % 100 mL IVPB     500 mg 200 mL/hr over 30 Minutes Intravenous Every 12 hours 01/22/18 1129     01/19/18 1700  ciprofloxacin (CIPRO) tablet 500 mg  Status:  Discontinued     500 mg Oral Daily with supper 01/19/18 1331 01/22/18 1058   01/19/18 0600  metroNIDAZOLE (FLAGYL) tablet 500 mg  Status:  Discontinued     500 mg Oral Every 8 hours 01/18/18 1442 01/22/18 1058   01/18/18 2000  ciprofloxacin (CIPRO) tablet 250 mg  Status:  Discontinued     250 mg Oral 2 times daily 01/18/18 1442 01/18/18 1628   01/18/18 1800  ciprofloxacin (CIPRO) IVPB 400 mg  Status:  Discontinued     400 mg 200 mL/hr over 60 Minutes Intravenous Every 24 hours 01/18/18 1628 01/19/18 1331   01/18/18 0000  levofloxacin (LEVAQUIN) IVPB 750 mg  Status:  Discontinued     750 mg 100 mL/hr over 90 Minutes Intravenous  Once 01/22/2018 1159 01/06/2018 1159   01/04/2018 1200  ciprofloxacin (CIPRO) IVPB 400 mg  Status:  Discontinued     400 mg 200 mL/hr over 60 Minutes Intravenous Every 12 hours 01/01/2018 1158 01/18/2018 1159   01/13/2018 1200  metroNIDAZOLE (FLAGYL) IVPB 500 mg  Status:  Discontinued     500 mg 100  mL/hr over 60 Minutes Intravenous Every 8 hours 2018/01/27 1158 2018/01/27 1159   01-27-18 1200  metroNIDAZOLE (FLAGYL) IVPB 500 mg     500 mg 100 mL/hr over 60 Minutes Intravenous Every 8 hours 01-27-2018 1159 01/18/18 2318   01/27/2018 1130  levofloxacin (LEVAQUIN) IVPB 750 mg     750 mg 100 mL/hr over 90 Minutes Intravenous  Once 2018-01-27 1116 27-Jan-2018 1336      Microbiology: Results for orders placed or performed during the hospital encounter of  01-27-2018  Respiratory Panel by PCR     Status: None   Collection Time: 01/18/18 10:28 AM  Result Value Ref Range Status   Adenovirus NOT DETECTED NOT DETECTED Final   Coronavirus 229E NOT DETECTED NOT DETECTED Final   Coronavirus HKU1 NOT DETECTED NOT DETECTED Final   Coronavirus NL63 NOT DETECTED NOT DETECTED Final   Coronavirus OC43 NOT DETECTED NOT DETECTED Final   Metapneumovirus NOT DETECTED NOT DETECTED Final   Rhinovirus / Enterovirus NOT DETECTED NOT DETECTED Final   Influenza A NOT DETECTED NOT DETECTED Final   Influenza B NOT DETECTED NOT DETECTED Final   Parainfluenza Virus 1 NOT DETECTED NOT DETECTED Final   Parainfluenza Virus 2 NOT DETECTED NOT DETECTED Final   Parainfluenza Virus 3 NOT DETECTED NOT DETECTED Final   Parainfluenza Virus 4 NOT DETECTED NOT DETECTED Final   Respiratory Syncytial Virus NOT DETECTED NOT DETECTED Final   Bordetella pertussis NOT DETECTED NOT DETECTED Final   Chlamydophila pneumoniae NOT DETECTED NOT DETECTED Final   Mycoplasma pneumoniae NOT DETECTED NOT DETECTED Final    Comment: Performed at [facility], [address] [place], [place] [address]  MRSA PCR Screening     Status: None   Collection Time: 01/22/18 10:52 AM  Result Value Ref Range Status   MRSA by PCR NEGATIVE NEGATIVE Final    Comment:        The GeneXpert MRSA Assay (FDA approved for NASAL specimens only), is one component of a comprehensive MRSA colonization surveillance program. It is not intended to diagnose MRSA infection nor to guide or monitor treatment for MRSA infections. Performed at [facility], [address], [place], [place] [address]    Studies: Dg Chest 2 View  Result Date: 01/21/2018 CLINICAL DATA:  Hypoxia. EXAM: CHEST - 2 VIEW COMPARISON:  Chest x-ray 8 Jan 27, 2018.  CT 2018-01-27, 09/05/2017. FINDINGS: Mediastinum and hilar structures are normal. Heart size stable. Left lower lobe atelectasis/consolidation with left-sided pleural  effusion. Small right pleural effusion. Elevation left hemidiaphragm again noted. No pneumothorax contrast noted over the colon. Degenerative change and osteopenia thoracic spine. IMPRESSION: 1. Left lower lobe atelectasis/consolidation with left-sided pleural effusion. Small right pleural effusion. These findings are new from prior exam. 2.  Elevation left hemidiaphragm again noted. Electronically Signed   By: Maisie Fus  Register   On: 01/21/2018 13:01   Dg Chest 2 View  Result Date: 01/27/18 CLINICAL DATA:  Left lower quadrant pain. EXAM: CHEST - 2 VIEW COMPARISON:  Eight/14/19 FINDINGS: Normal heart size. Aortic atherosclerosis. No pleural effusion or edema. No airspace opacities. Asymmetric elevation of left hemidiaphragm is again noted. No acute bone abnormalities noted. Spondylosis identified within the thoracic spine. IMPRESSION: No active cardiopulmonary disease. Aortic Atherosclerosis (ICD10-I70.0). Electronically Signed   By: Signa Kell M.D.   On: 2018-01-27 09:14   Ct Abdomen Pelvis W Contrast  Result Date: 2018-01-27 CLINICAL DATA:  LEFT lower quadrant pain. Nausea. PET-CT 02/16/2016 EXAM: CT ABDOMEN AND PELVIS WITH CONTRAST TECHNIQUE: Multidetector  CT imaging of the abdomen and pelvis was performed using the standard protocol following bolus administration of intravenous contrast. CONTRAST:  60mL OMNIPAQUE IOHEXOL 300 MG/ML  SOLN COMPARISON:  PET-CT 02/16/2016, CT chest 09/05/2017 FINDINGS: Lower chest: Mild nodular airspace disease in the RIGHT lower lobe is new from comparison CT 09/05/2017 Hepatobiliary: No focal hepatic lesion. Mild periportal edema. Postcholecystectomy. Pancreas: Pancreas is normal. No ductal dilatation. No pancreatic inflammation. Spleen: Normal spleen Adrenals/urinary tract: Adrenal glands normal. Benign cyst of the LEFT kidney. No renal obstruction. Exophytic from the lower pole of the RIGHT kidney there is a ovoid high-density mass measuring 2.7 cm with HU equal  70. High-density lesion on comparison noncontrast PET-CT scan of 02/16/2016 at equal density; however, lesion was smaller measuring 2.2 by 1.8 cm. Lesion had no significant metabolic activity at that time. No renal obstruction. There is moderate volume of gas within the bladder collecting non dependently (image 62/6). Stomach/Bowel: Stomach, small-bowel normal. Post appendectomy. There is mild thickening of the ascending colon. Small amount fluid surrounding the ascending colon. Transverse descending colon appear normal. Rectosigmoid colon normal. Vascular/Lymphatic: Abdominal aorta is normal caliber with atherosclerotic calcification. There is no retroperitoneal or periportal lymphadenopathy. No pelvic lymphadenopathy. Reproductive: Post hysterectomy Other: Moderate amount of simple fluid the posterior cul-de-sac. Musculoskeletal: no aggressive osseous lesion. Severe degenerate changes of the lower lumbar spine. IMPRESSION: 1. New nodular airspace disease in the RIGHT lower lobe could represent aspiration pneumonitis or pneumonia. 2. Mild thickening of the ascending colon suggests segmental colitis. Differential would include inflammatory bowel disease, infectious colitis, drug induced colitis, and ischemic colitis. 3. Moderate volume of gas within the bladder. Recommend correlation with instrumentation. 4. High-density mass exophytic from the lower pole of the RIGHT kidney is indeterminate. Recommend MRI with and without contrast for further evaluation. Electronically Signed   By: Genevive BiStewart  Edmunds M.D.   On: 01/20/2018 10:51   Dg Chest Port 1 View  Result Date: 01/22/2018 CLINICAL DATA:  Shortness of breath.  Current smoker.  Hypertension. EXAM: PORTABLE CHEST 1 VIEW COMPARISON:  01/21/2018. FINDINGS: LEFT lower lobe atelectasis and consolidation with LEFT pleural effusion is redemonstrated. Unchanged cardiomediastinal silhouette. Calcified tortuous aorta. Mild vascular congestion. IMPRESSION: No active  disease. Electronically Signed   By: Elsie StainJohn T Curnes M.D.   On: 01/22/2018 07:18   [place] Ekg Site Rite  Result Date: 01/22/2018 If Site Rite image not attached, placement could not be confirmed due to current cardiac rhythm.   Consults: Treatment Team:  Lamont DowdyKolluru, Sarath, MD   Subjective:    Overnight Issues: Patient resting very comfortably today.  Has had difficulty sleeping at nights, also complaining of soreness when she swallows.  Has been unable to take in enough calories.  Discussed with her NG tube placement with dietary supplementation  Objective:  Vital signs for last 24 hours: Temp:  [97.2 F (36.2 C)-98.1 F (36.7 C)] 97.8 F (36.6 C) (01/04 0400) Pulse Rate:  [85-120] 93 (01/04 0600) Resp:  [14-29] 21 (01/04 0700) BP: (104-145)/(42-124) 115/54 (01/04 0700) SpO2:  [89 %-96 %] 93 % (01/04 0600) Weight:  [59.6 kg] 59.6 kg (01/04 0402)  Hemodynamic parameters for last 24 hours:    Intake/Output from previous day: 01/03 0701 - 01/04 0700 In: 663 [I.V.:556.4; IV Piggyback:106.6] Out: 346 [Urine:346]  Intake/Output this shift: No intake/output data recorded.  Vent settings for last 24 hours:    Physical Exam:  Vital signs:       Please see the above listed vital signs Patient is awake, responsive  and communicating HEENT:           Trachea midline, no oral lesions appreciated, on facemask oxygen Cardiovascular:            Heart rate has significantly improved in the 90s.  Appears to have intermittent sinus mechanism Pulmonary:       Scant basilar crackles Abdominal:      Hypoactive bowel sounds, not distended, no significant point tenderness to palpation Extremities:     Clubbing, cyanosis or edema noted Neurologic:      Patient moves all extremities, no clear focal deficits noted  Assessment/Plan:  Septic shock.  On meropenem.  Likely etiology is colitis ischemic versus infectious.  Patient is on low-dose Neo-Synephrine.  Hemodynamics have improved we will try to  wean off neo-today  Respiratory failure.  Presently doing much better.  Bronchospasm resolved.  On nasal cannula, will start to de-escalate Solu-Medrol  Acute renal failure.    Following BMP closely.    Atrial fibrillation.  Responded well to amiodarone.  Stable ventricular response  Leukocytosis.    Will repeat CBC  Heart failure with reduced ejection fraction  Right renal mass.  Will need furthere valuation in the outpatient setting  Tora KindredJohn Emanual Lamountain, DO  Ahmar Pickrell 01/26/2018  *Care during the described time interval was provided by me and/or other providers on the critical care team.  I have reviewed this patient's available data, including medical history, events of note, physical examination and test results as part of my evaluation. Patient ID: Christine Mcclain, female   DOB: 07/06/1940, 78 y.o.   MRN: [id number] Patient ID: Christine Mcclain, female   DOB: 11/14/1940, 78 y.o.   MRN: [id number] Patient ID: Christine Mcclain, female   DOB: 07/16/1940, 78 y.o.   MRN: [id number]

## 2018-01-26 NOTE — Progress Notes (Signed)
[facility]  ROUNDING NOTE   Subjective:   Husband at bedside.  UOP .   BUN 100  Patient not eating.   Objective:  Vital signs in last 24 hours:  Temp:  [97.6 F (36.4 C)-98.1 F (36.7 C)] 97.7 F (36.5 C) (01/04 0800) Pulse Rate:  [85-120] 93 (01/04 0600) Resp:  [14-29] 21 (01/04 0700) BP: (105-145)/(42-124) 115/54 (01/04 0700) SpO2:  [89 %-96 %] 95 % (01/04 0800) Weight:  [59.6 kg] 59.6 kg (01/04 0402)  Weight change: 0.4 kg Filed Weights   01/24/18 0500 01/25/18 0500 01/26/18 0402  Weight: 60 kg 59.2 kg 59.6 kg    Intake/Output: I/O last 3 completed shifts: In: 1315.2 [I.V.:1113; IV Piggyback:202.2] Out: 696 [Urine:696]   Intake/Output this shift:  Total I/O In: 133.1 [I.V.:58.6; IV Piggyback:74.5] Out: 150 [Urine:150]  Physical Exam: General: Critically ill,   Head: [place]/AT    Eyes: Anicteric, PERRL  Neck: Supple, trachea midline  Lungs:  Clear, 4L [place]  Heart: irregular  Abdomen:  soft  Extremities:  no peripheral edema.  Neurologic: Nonfocal, moving all four extremities  Skin: No lesions        Basic Metabolic Panel: Recent Labs  Lab 01/22/18 0837 01/23/18 0606 01/23/18 1909 01/24/18 0037 01/25/18 1000 01/26/18 0002  NA 132* 134* 134* 134* 135 134*  K 5.1 5.4* 5.3* 5.0 5.1 5.1  CL 108 109 110 109 107 108  CO2 18* 16* 16* 17* 17* 17*  GLUCOSE 107* 112* 139* 169* 149* 151*  BUN 70* 78* 83* 85* 96* 100*  CREATININE 2.82* 3.05* 3.36* 3.38* 3.44* 3.45*  CALCIUM 8.4* 8.7* 8.6* 8.6* 8.0* 7.8*  MG 2.1  --   --   --   --   --   PHOS 6.4*  --   --   --  6.7* 6.5*    Liver Function Tests: Recent Labs  Lab 01/22/18 0837 01/25/18 1000 01/26/18 0002  AST 205*  --   --   ALT 66*  --   --   ALKPHOS 41  --   --   BILITOT 0.8  --   --   PROT 4.4*  --   --   ALBUMIN 1.9* 2.4* 2.6*   No results for input(s): LIPASE, AMYLASE in the last 168 hours. No results for input(s): AMMONIA in the last 168 hours.  CBC: Recent Labs  Lab  01/21/18 0532 01/22/18 0837 01/22/18 1052 01/23/18 0606  WBC 13.9* 15.4* 14.5* 15.6*  NEUTROABS 11.5* 12.9* 12.3*  --   HGB 14.5 12.8 13.6 14.0  HCT 45.1 39.5 42.0 43.5  MCV 101.8* 101.8* 101.7* 102.1*  PLT PLATELET CLUMPS NOTED ON SMEAR, UNABLE TO ESTIMATE 130* 119* 118*    Cardiac Enzymes: Recent Labs  Lab 01/22/18 0706  TROPONINI 0.12*    BNP: Invalid input(s): POCBNP  CBG: Recent Labs  Lab 01/25/18 1949 01/25/18 2350 01/26/18 0317 01/26/18 0752 01/26/18 1145  GLUCAP 123* 133* 122* 113* 125*    Microbiology: Results for orders placed or performed during the hospital encounter of 12/31/2017  Respiratory Panel by PCR     Status: None   Collection Time: 01/18/18 10:28 AM  Result Value Ref Range Status   Adenovirus NOT DETECTED NOT DETECTED Final   Coronavirus 229E NOT DETECTED NOT DETECTED Final   Coronavirus HKU1 NOT DETECTED NOT DETECTED Final   Coronavirus NL63 NOT DETECTED NOT DETECTED Final   Coronavirus OC43 NOT DETECTED NOT DETECTED Final   Metapneumovirus NOT DETECTED NOT DETECTED Final  Rhinovirus / Enterovirus NOT DETECTED NOT DETECTED Final   Influenza A NOT DETECTED NOT DETECTED Final   Influenza B NOT DETECTED NOT DETECTED Final   Parainfluenza Virus 1 NOT DETECTED NOT DETECTED Final   Parainfluenza Virus 2 NOT DETECTED NOT DETECTED Final   Parainfluenza Virus 3 NOT DETECTED NOT DETECTED Final   Parainfluenza Virus 4 NOT DETECTED NOT DETECTED Final   Respiratory Syncytial Virus NOT DETECTED NOT DETECTED Final   Bordetella pertussis NOT DETECTED NOT DETECTED Final   Chlamydophila pneumoniae NOT DETECTED NOT DETECTED Final   Mycoplasma pneumoniae NOT DETECTED NOT DETECTED Final    Comment: Performed at [facility], [address], [place], [place] [address]  MRSA PCR Screening     Status: None   Collection Time: 01/22/18 10:52 AM  Result Value Ref Range Status   MRSA by PCR NEGATIVE NEGATIVE Final    Comment:        The GeneXpert MRSA  Assay (FDA approved for NASAL specimens only), is one component of a comprehensive MRSA colonization surveillance program. It is not intended to diagnose MRSA infection nor to guide or monitor treatment for MRSA infections. Performed at [facility], [address] [place], [place] [address]     Coagulation Studies: No results for input(s): LABPROT, INR in the last 72 hours.  Urinalysis: No results for input(s): COLORURINE, LABSPEC, PHURINE, GLUCOSEU, HGBUR, BILIRUBINUR, KETONESUR, PROTEINUR, UROBILINOGEN, NITRITE, LEUKOCYTESUR in the last 72 hours.  Invalid input(s): APPERANCEUR    Imaging: Dg Abd Portable 1v  Result Date: 01/26/2018 CLINICAL DATA:  NG tube placement EXAM: PORTABLE ABDOMEN - 1 VIEW COMPARISON:  01/10/2018 FINDINGS: Feeding tube has been placed. The tip is in the fundus of the stomach. No disproportionate dilatation of bowel. No obvious free intraperitoneal gas. IMPRESSION: Feeding tube tip is in the fundus of the stomach. Electronically Signed   By: Jolaine Click M.D.   On: 01/26/2018 11:43     Medications:   . amiodarone 30 mg/hr (01/26/18 1000)  . dexmedetomidine (PRECEDEX) IV infusion Stopped (01/25/18 0552)  . meropenem (MERREM) IV 200 mL/hr at 01/26/18 1000  . phenylephrine (NEO-SYNEPHRINE) Adult infusion 8 mcg/min (01/26/18 1000)   . budesonide (PULMICORT) nebulizer solution  0.5 mg Nebulization BID  . feeding supplement  1 Container Oral TID BM  . furosemide  40 mg Intravenous Daily  . ipratropium-albuterol  3 mL Nebulization Q6H  . magic mouthwash  5 mL Oral QID   And  . lidocaine  5 mL Mouth/Throat TID  . LORazepam      . mouth rinse  15 mL Mouth Rinse BID  . methylPREDNISolone (SOLU-MEDROL) injection  60 mg Intravenous Q12H  . nystatin  5 mL Oral QID  . pantoprazole (PROTONIX) IV  40 mg Intravenous Q24H  . sodium chloride flush  10-40 mL Intracatheter Q12H  . vitamin C  250 mg Oral BID   acetaminophen **OR** acetaminophen,  bisacodyl, HYDROcodone-acetaminophen, ipratropium-albuterol, morphine injection, ondansetron **OR** ondansetron (ZOFRAN) IV, phenol, sodium chloride flush, traZODone  Assessment/ Plan:  [name] is a 78 y.o. white female with COPD, hypertension, hyperlipidemia, osteoarthritis, osteopenia, systolic congestive heart failure EF 30% who was admitted to [facility] on 01/09/2018 for colitis, pneumonia and acute renal failure  1. Acute kidney failure on chronic kidney disease stage III: baseline creatinine of 1.18, GFR of 43 on 08/15/17.  2. Hyponatremia 3. Hyperkalemia 4. Metabolic acidosis 5. Hematuria 6. Renal Mass right - incidental finding.   Plan:  Chronic kidney  disease secondary to hypertension Acute renal failure secondary to IV contrast exposure, prerenal azotemia and hypotension  - holding naproxen.  - Needs outpatient follow up on renal mass.  - Discussed with family about overall prognosis. Husband states that patient would want dialysis if indicated.  - BUN elevated.  - Furosemide 40mg  IV q24   LOS: 9 Rosella Crandell 1/4/202012:24 PM

## 2018-01-26 NOTE — Progress Notes (Signed)
[employer] [facility] - [place] at [facility]   PATIENT NAME: Christine Mcclain    MR#:  [id number]  DATE OF BIRTH:  [birth year]  SUBJECTIVE:   Family at bedside NG tube placed this morning by nurse  REVIEW OF SYSTEMS:    Able to obtain patient is drowsy/lethargic   Tolerating Diet: NGT      DRUG ALLERGIES:   Allergies  Allergen Reactions  . Gabapentin Hives  . Naproxen Hives  . Penicillins Hives    Has patient had a PCN reaction causing immediate rash, facial/tongue/throat swelling, SOB or lightheadedness with hypotension: No Has patient had a PCN reaction causing severe rash involving mucus membranes or skin necrosis: No Has patient had a PCN reaction that required hospitalization: No Has patient had a PCN reaction occurring within the last 10 years: No If all of the above answers are "NO", then may proceed with Cephalosporin use.  . Codeine Rash    VITALS:  Blood pressure (!) 89/74, pulse (!) 111, temperature 97.6 F (36.4 C), temperature source Axillary, resp. rate (!) 24, height 5\' 2"  (1.575 m), weight 59.6 kg, SpO2 95 %.  PHYSICAL EXAMINATION:  Constitutional: Appears  frail. HENT: Normocephalic. . NG tube placed Eyes: Conjunctivae are normal. no scleral icterus.  Neck: Normal ROM. Neck supple. No JVD. No tracheal deviation. CVS: RRR, S1/S2 +, no murmurs, no gallops, no carotid bruit.  Pulmonary: Normal respiratory effort with bilateral rails.  Abdominal: Positive bowel sounds no distension, tenderness, rebound or guarding.  Musculoskeletal: Normal range of motion. No edema and no tenderness.  Neuro: Patient sleeping Skin: Skin is warm and dry. No rash noted.      LABORATORY PANEL:   CBC Recent Labs  Lab 01/23/18 0606  WBC 15.6*  HGB 14.0  HCT 43.5  PLT 118*   ------------------------------------------------------------------------------------------------------------------  Chemistries  Recent Labs  Lab 01/22/18 0837  01/26/18 0002  NA  132*   < > 134*  K 5.1   < > 5.1  CL 108   < > 108  CO2 18*   < > 17*  GLUCOSE 107*   < > 151*  BUN 70*   < > 100*  CREATININE 2.82*   < > 3.45*  CALCIUM 8.4*   < > 7.8*  MG 2.1  --   --   AST 205*  --   --   ALT 66*  --   --   ALKPHOS 41  --   --   BILITOT 0.8  --   --    < > = values in this interval not displayed.   ------------------------------------------------------------------------------------------------------------------  Cardiac Enzymes Recent Labs  Lab 01/22/18 0706  TROPONINI 0.12*   ------------------------------------------------------------------------------------------------------------------  RADIOLOGY:  Dg Abd Portable 1v  Result Date: 01/26/2018 CLINICAL DATA:  NG tube placement EXAM: PORTABLE ABDOMEN - 1 VIEW COMPARISON:  12/25/2017 FINDINGS: Feeding tube has been placed. The tip is in the fundus of the stomach. No disproportionate dilatation of bowel. No obvious free intraperitoneal gas. IMPRESSION: Feeding tube tip is in the fundus of the stomach. Electronically Signed   By: Jolaine Click M.D.   On: 01/26/2018 11:43     ASSESSMENT AND PLAN:   78 year old female with history of COPD who presented to the emergency room with abdominal pain and nausea.  1.  Septic shock: Etiology is colitis ischemic versus infectious Continue pressors to keep map greater than 65 Appreciate intensivist consult Continue meropenem for pneumonia and colitis  2.  Acute on chronic kidney  disease: Acute kidney injury in the setting of IV contrast, prerenal azotemia and septic shock Nephrology consultation appreciated Continue management as per nephrology Will need outpatient follow-up on renal mass (right side)  3.  Acute hypoxic respiratory failure in the setting of acute on chronic systolic heart failure with ejection fraction 25 to 30% as well as mild COPD exacerbation and pneumonia Continue steroids Continue IV Lasix Wean oxygen as tolerated Continue meropenem for  pneumonia  4.  Elevated troponin due to demand ischemia: Patient ruled out for ACS 5.  New onset atrial fibrillation in the setting of above issues: Patient responded well to amiodarone   Management plans discussed with the patient'S FAMILY AND THEY ARE in agreement.  CODE STATUS: PARTIAL  TOTAL TIME TAKING CARE OF THIS PATIENT: 25 minutes.     POSSIBLE D/C ??, DEPENDING ON CLINICAL CONDITION.   Joli Koob M.D on 01/26/2018 at 1:26 PM  Between 7am to 6pm - Pager - [phone] After 6pm go to [email or web address] - password [email or web address]  [facility]  Office  [phone]  CC: Primary care physician; Hillery Aldo, MD  Note: This dictation was prepared with Dragon dictation along with smaller phrase technology. Any transcriptional errors that result from this process are unintentional.

## 2018-01-26 NOTE — Progress Notes (Signed)
Patient on 4L [place] all night, continues to be on Neo-synephrine and Amiodarone. Patient complaining of back pain all night and refusing to be repositioned. Pain medication offered and patient refused due to not being able to swallow the medication. Patient states her mouth is too sore and that all PO medication makes her mouth burn and therefore makes it painful to swallow. Urine output is still very minimal; family at bedside updated.

## 2018-01-26 NOTE — Progress Notes (Addendum)
Brief Nutrition Note  Consult received for enteral/tube feeding initiation and management. RN has placed NGT. RN reports pt with very poor to no po intake since admission.   Adult Enteral Nutrition Protocol initiated. Recommend tube feeding rate advancement q 6 hours as pt at risk for refeeding. Recommend monitoring K, Mg, and P labs daily. Follow up assessment to follow.  Admitting Dx: Dehydration [E86.0] Colitis [K52.9] Chronic obstructive pulmonary disease, unspecified COPD type (HCC) [J44.9] Community acquired pneumonia of right lower lobe of lung (HCC) [J18.1]  Body mass index is 24.03 kg/m. Pt meets criteria for normal based on current BMI.  Labs:  Recent Labs  Lab 01/22/18 0837  01/24/18 0037 01/25/18 1000 01/26/18 0002  NA 132*   < > 134* 135 134*  K 5.1   < > 5.0 5.1 5.1  CL 108   < > 109 107 108  CO2 18*   < > 17* 17* 17*  BUN 70*   < > 85* 96* 100*  CREATININE 2.82*   < > 3.38* 3.44* 3.45*  CALCIUM 8.4*   < > 8.6* 8.0* 7.8*  MG 2.1  --   --   --   --   PHOS 6.4*  --   --  6.7* 6.5*  GLUCOSE 107*   < > 169* 149* 151*   < > = values in this interval not displayed.    Roslyn Smiling, MS, RD, LDN Pager # [phone] After hours/ weekend pager # [phone]

## 2018-01-26 NOTE — Plan of Care (Addendum)
Dobhoff placed this shift to left nares, this was very stressful for patient and required multiple attempts as she was unable to swallow to aid in passage.  Tube feeds infusing at this time.  Pt resting much more comfortably.  UOP minimal but improved over past two shifts with lasix, remains amber/brown.  Pt remains on 4 liters [place].  MAP>65 with 8 mcg neo.  PT converted back to AFib this shift with amiodarone infusing at 30 mg/hr.  Dr Lonn Georgia aware.

## 2018-01-27 ENCOUNTER — Inpatient Hospital Stay: Payer: Medicare Other

## 2018-01-27 LAB — CBC
HCT: 37.6 % (ref 36.0–46.0)
Hemoglobin: 12.4 g/dL (ref 12.0–15.0)
MCH: 32.6 pg (ref 26.0–34.0)
MCHC: 33 g/dL (ref 30.0–36.0)
MCV: 98.9 fL (ref 80.0–100.0)
Platelets: 98 10*3/uL — ABNORMAL LOW (ref 150–400)
RBC: 3.8 MIL/uL — ABNORMAL LOW (ref 3.87–5.11)
RDW: 15.4 % (ref 11.5–15.5)
WBC: 19.3 10*3/uL — ABNORMAL HIGH (ref 4.0–10.5)
nRBC: 0 % (ref 0.0–0.2)

## 2018-01-27 LAB — RENAL FUNCTION PANEL
Albumin: 2.3 g/dL — ABNORMAL LOW (ref 3.5–5.0)
Anion gap: 10 (ref 5–15)
BUN: 104 mg/dL — AB (ref 8–23)
CO2: 17 mmol/L — ABNORMAL LOW (ref 22–32)
Calcium: 7.9 mg/dL — ABNORMAL LOW (ref 8.9–10.3)
Chloride: 109 mmol/L (ref 98–111)
Creatinine, Ser: 3.35 mg/dL — ABNORMAL HIGH (ref 0.44–1.00)
GFR calc Af Amer: 14 mL/min — ABNORMAL LOW (ref >60)
GFR calc non Af Amer: 12 mL/min — ABNORMAL LOW (ref >60)
Glucose, Bld: 205 mg/dL — ABNORMAL HIGH (ref 70–99)
Phosphorus: 5.9 mg/dL — ABNORMAL HIGH (ref 2.5–4.6)
Potassium: 4.7 mmol/L (ref 3.5–5.1)
Sodium: 136 mmol/L (ref 135–145)

## 2018-01-27 LAB — COMPREHENSIVE METABOLIC PANEL
ALT: 47 U/L — ABNORMAL HIGH (ref 0–44)
ANION GAP: 7 (ref 5–15)
AST: 115 U/L — ABNORMAL HIGH (ref 15–41)
Albumin: 2.2 g/dL — ABNORMAL LOW (ref 3.5–5.0)
Alkaline Phosphatase: 52 U/L (ref 38–126)
BUN: 115 mg/dL — ABNORMAL HIGH (ref 8–23)
CO2: 19 mmol/L — ABNORMAL LOW (ref 22–32)
Calcium: 8.1 mg/dL — ABNORMAL LOW (ref 8.9–10.3)
Chloride: 111 mmol/L (ref 98–111)
Creatinine, Ser: 3.29 mg/dL — ABNORMAL HIGH (ref 0.44–1.00)
GFR calc Af Amer: 15 mL/min — ABNORMAL LOW (ref >60)
GFR calc non Af Amer: 13 mL/min — ABNORMAL LOW (ref >60)
Glucose, Bld: 180 mg/dL — ABNORMAL HIGH (ref 70–99)
POTASSIUM: 5 mmol/L (ref 3.5–5.1)
Sodium: 137 mmol/L (ref 135–145)
Total Bilirubin: 0.5 mg/dL (ref 0.3–1.2)
Total Protein: 4.7 g/dL — ABNORMAL LOW (ref 6.5–8.1)

## 2018-01-27 LAB — MAGNESIUM: Magnesium: 2.3 mg/dL (ref 1.7–2.4)

## 2018-01-27 LAB — GLUCOSE, CAPILLARY
GLUCOSE-CAPILLARY: 164 mg/dL — AB (ref 70–99)
Glucose-Capillary: 150 mg/dL — ABNORMAL HIGH (ref 70–99)
Glucose-Capillary: 161 mg/dL — ABNORMAL HIGH (ref 70–99)
Glucose-Capillary: 185 mg/dL — ABNORMAL HIGH (ref 70–99)
Glucose-Capillary: 195 mg/dL — ABNORMAL HIGH (ref 70–99)
Glucose-Capillary: 198 mg/dL — ABNORMAL HIGH (ref 70–99)
Glucose-Capillary: 199 mg/dL — ABNORMAL HIGH (ref 70–99)

## 2018-01-27 LAB — PHOSPHORUS: Phosphorus: 6.1 mg/dL — ABNORMAL HIGH (ref 2.5–4.6)

## 2018-01-27 MED ORDER — INSULIN ASPART 100 UNIT/ML ~~LOC~~ SOLN
0.0000 [IU] | SUBCUTANEOUS | Status: DC
  Administered 2018-01-27 (×2): 2 [IU] via SUBCUTANEOUS
  Administered 2018-01-27: 1 [IU] via SUBCUTANEOUS
  Administered 2018-01-27 – 2018-01-28 (×5): 2 [IU] via SUBCUTANEOUS
  Administered 2018-01-28: 1 [IU] via SUBCUTANEOUS
  Administered 2018-01-28 – 2018-01-29 (×3): 2 [IU] via SUBCUTANEOUS
  Administered 2018-01-29: 1 [IU] via SUBCUTANEOUS
  Administered 2018-01-29 (×2): 2 [IU] via SUBCUTANEOUS
  Filled 2018-01-27 (×13): qty 1

## 2018-01-27 MED ORDER — VITAL AF 1.2 CAL PO LIQD
1000.0000 mL | ORAL | Status: DC
  Administered 2018-01-27 – 2018-01-28 (×2): 1000 mL

## 2018-01-27 MED ORDER — JUVEN PO PACK
1.0000 | PACK | Freq: Two times a day (BID) | ORAL | Status: DC
  Administered 2018-01-27 – 2018-01-29 (×4): 1

## 2018-01-27 MED ORDER — SENNOSIDES 8.8 MG/5ML PO SYRP
5.0000 mL | ORAL_SOLUTION | Freq: Two times a day (BID) | ORAL | Status: DC
  Administered 2018-01-27 – 2018-01-29 (×5): 5 mL via ORAL
  Filled 2018-01-27 (×6): qty 5

## 2018-01-27 MED ORDER — ADULT MULTIVITAMIN LIQUID CH
15.0000 mL | Freq: Every day | ORAL | Status: DC
  Administered 2018-01-28 – 2018-01-29 (×2): 15 mL
  Filled 2018-01-27 (×3): qty 15

## 2018-01-27 MED ORDER — IPRATROPIUM-ALBUTEROL 0.5-2.5 (3) MG/3ML IN SOLN
3.0000 mL | Freq: Three times a day (TID) | RESPIRATORY_TRACT | Status: DC
  Administered 2018-01-27 – 2018-01-29 (×6): 3 mL via RESPIRATORY_TRACT
  Filled 2018-01-27 (×6): qty 3

## 2018-01-27 MED ORDER — ALBUTEROL SULFATE (2.5 MG/3ML) 0.083% IN NEBU: 2.5000 mg | INHALATION_SOLUTION | RESPIRATORY_TRACT | Status: DC | PRN

## 2018-01-27 MED ORDER — FREE WATER
30.0000 mL | Status: DC
  Administered 2018-01-27 – 2018-01-29 (×13): 30 mL

## 2018-01-27 NOTE — Progress Notes (Addendum)
Nutrition Follow-up  DOCUMENTATION CODES:   Non-severe (moderate) malnutrition in context of chronic illness  INTERVENTION:  Initiate new goal TF regimen of Vital AF 1.2 at 45 mL/hr (1080 mL goal daily volume) per NGT. Provides 1296 kcal, 81 grams of protein, 875 mL H2O daily.  Provide minimum free water flush of 30 mL Q4hrs to maintain tube patency.  Provide liquid MVI daily per tube to meet 100% RDIs for vitamins/minerals.  Provide Juven packet BID per tube, each supplement provides 80 kcal, 14 grams of amino acids, and vitamins/minerals essential for wound healing.  NUTRITION DIAGNOSIS:   Moderate Malnutrition related to chronic illness(COPD) as evidenced by moderate fat depletion, moderate muscle depletion, severe muscle depletion.  Ongoing.  GOAL:   Patient will meet greater than or equal to 90% of their needs  Met with TF regimen.  MONITOR:   PO intake, Supplement acceptance, Labs, Weight trends, Skin, I & O's  REASON FOR ASSESSMENT:   Malnutrition Screening Tool, Consult Poor PO, Enteral/tube feeding initiation and management  ASSESSMENT:   78 y.o. female with a known history of COPD, on chronic oxygen 2 L all the time, arthritis, essential hypertension, GERD recently started on PPIs presents with left lower quadrant abdominal pain for last 3 to 4 days associated with nausea, denies any diarrhea.   -Patient has had very poor PO intake during admission. This was discussed on rounds last week and plan was for NGT placement. This was placed yesterday and tube feeds were initiated.  Met with patient and her family members at bedside. She has NGT in place. Patient reporting some abdominal discomfort. May be related to high fiber content of Jevity. Abdomen is soft. Last BM was a medium type 5 on 12/30, which may also be contributing to discomfort. Plan is to start patient on bowel regimen today.  Enteral Access: NGT placed 1/4; terminates in distal stomach per abdominal  x-ray 1/5; 65 cm at left nare  TF: pt was initiated yesterday on Jevity 1.2 Cal; today at the protocol's goal of 50 mL/hr but having some abdominal discomfort  Medications reviewed and include: Boost Breeze po TID, Lasix 40 mg daily IV, Novolog 0-9 units Q4hrs, magic mouthwash, lidocaine viscous mouth solution, Medline mouth rinse, Solu-Medrol 60 mg Q12hrs IV, nystatin, pantoprazole, sennosides, vitamin C 250 mg BID, amiodarone gtt, meropenem, phenylephrine gtt at 8 mcg/min.  Labs reviewed: CBG 155-199, CO2 17, BUN 104, Creatinine 3.35, Phosphorus 5.9.  I/O: 711 mL UOP yesterday (0.5 mL/kg/hr)  Weight trend: 60.5 kg on 1/5; +8.5 kg from wt on 12/27  Discussed with RN. Patient was complaining of some abdominal discomfort/pain. KUB was taken that did not show any obstructive gas pattern. Discussed that it may be the fiber in the Jevity so plan is to switch her to a lower-fiber formula. Patient still requesting to drink orange Boost.  Diet Order:   Diet Order    None     EDUCATION NEEDS:   Education needs have been addressed  Skin:  Skin Assessment: Skin Integrity Issues:(stg II right posterior thigh; DTI to sacrum)  Last BM:  01/21/2018 - medium type 5  Height:   Ht Readings from Last 1 Encounters:  01/22/18 '5\' 2"'$  (1.575 m)   Weight:   Wt Readings from Last 1 Encounters:  01/27/18 60.5 kg   Ideal Body Weight:  52.3 kg  BMI:  Body mass index is 24.4 kg/m.  Estimated Nutritional Needs:   Kcal:  1300-1500 (25-30 kcal/kg)  Protein:  78-88 grams (  1.5-1.7 grams/kg)  Fluid:  UOP + 1 L  Willey Blade, MS, RD, LDN Office: [phone] Pager: [phone] After Hours/Weekend Pager: [phone]

## 2018-01-27 NOTE — Progress Notes (Signed)
[employer] - [place] at [facility]   PATIENT NAME: Christine Mcclain    MR#:  [id number]  DATE OF BIRTH:  11/22/1940  SUBJECTIVE:  Patient still having difficulty swallowing.  Does not want to take Magic mouthwash or lidocaine  Patient more alert this morning.ros  REVIEW OF SYSTEMS:    Review of Systems  Constitutional: Positive for malaise/fatigue and weight loss. Negative for chills and fever.  HENT: Negative.  Negative for ear discharge, ear pain, hearing loss, nosebleeds and sore throat.        Mouth sores  Eyes: Negative.  Negative for blurred vision and pain.  Respiratory: Negative.  Negative for cough, hemoptysis, shortness of breath and wheezing.   Cardiovascular: Negative.  Negative for chest pain, palpitations and leg swelling.  Gastrointestinal: Positive for abdominal pain. Negative for blood in stool, diarrhea, nausea and vomiting.  Genitourinary: Negative.  Negative for dysuria.  Musculoskeletal: Negative.  Negative for back pain.  Skin: Negative.   Neurological: Negative for dizziness, tremors, speech change, focal weakness, seizures and headaches.  Endo/Heme/Allergies: Negative.  Does not bruise/bleed easily.  Psychiatric/Behavioral: Negative.  Negative for depression, hallucinations and suicidal ideas.    Tolerating Diet: NGT      DRUG ALLERGIES:   Allergies  Allergen Reactions  . Gabapentin Hives  . Naproxen Hives  . Penicillins Hives    Has patient had a PCN reaction causing immediate rash, facial/tongue/throat swelling, SOB or lightheadedness with hypotension: No Has patient had a PCN reaction causing severe rash involving mucus membranes or skin necrosis: No Has patient had a PCN reaction that required hospitalization: No Has patient had a PCN reaction occurring within the last 10 years: No If all of the above answers are "NO", then may proceed with Cephalosporin use.  . Codeine Rash    VITALS:  Blood pressure (!) 124/52, pulse (!) 102,  temperature 97.7 F (36.5 C), temperature source Axillary, resp. rate (!) 28, height 5\' 2"  (1.575 m), weight 60.5 kg, SpO2 (!) 88 %.  PHYSICAL EXAMINATION:  Constitutional: Appears  frail. HENT: Normocephalic. . NG tube placed Eyes: Conjunctivae are normal. no scleral icterus.  Neck: Normal ROM. Neck supple. No JVD. No tracheal deviation. CVS: irr, irr tachy no murmurs, no gallops, no carotid bruit.  Pulmonary: Normal respiratory effort with bilateral rails.  Abdominal: Positive bowel sounds no distension, mild RLQ tenderness,no rebound or guarding.  Musculoskeletal: Normal range of motion. No edema and no tenderness.  Neuro: Patient sleeping Skin: Skin is warm and dry. No rash noted.      LABORATORY PANEL:   CBC Recent Labs  Lab 01/26/18 1332  WBC 22.3*  HGB 13.3  HCT 39.5  PLT 115*   ------------------------------------------------------------------------------------------------------------------  Chemistries  Recent Labs  Lab 01/22/18 0837  01/27/18 0503  NA 132*   < > 136  K 5.1   < > 4.7  CL 108   < > 109  CO2 18*   < > 17*  GLUCOSE 107*   < > 205*  BUN 70*   < > 104*  CREATININE 2.82*   < > 3.35*  CALCIUM 8.4*   < > 7.9*  MG 2.1  --   --   AST 205*  --   --   ALT 66*  --   --   ALKPHOS 41  --   --   BILITOT 0.8  --   --    < > = values in this interval not displayed.   ------------------------------------------------------------------------------------------------------------------  Cardiac Enzymes Recent Labs  Lab 01/22/18 0706  TROPONINI 0.12*   ------------------------------------------------------------------------------------------------------------------  RADIOLOGY:  Dg Abd 1 View  Result Date: 01/27/2018 CLINICAL DATA:  Abdominal pain beginning today during tube feeding. EXAM: ABDOMEN - 1 VIEW COMPARISON:  01/26/2018 and CT 01/02/2018 FINDINGS: Enteric tube is present with tip over the distal stomach just left of midline. Right femoral  catheter likely venous in origin with tip in the region of the external iliac vein over the level of the sacroiliac joint. Bowel gas pattern is nonobstructive. Remainder the exam is unchanged. IMPRESSION: Nonobstructive bowel gas pattern. Enteric tube with tip over the distal stomach just left of midline. Electronically Signed   By: Elberta Fortisaniel  Boyle M.D.   On: 01/27/2018 08:20   Dg Abd Portable 1v  Result Date: 01/26/2018 CLINICAL DATA:  NG tube placement EXAM: PORTABLE ABDOMEN - 1 VIEW COMPARISON:  01/11/2018 FINDINGS: Feeding tube has been placed. The tip is in the fundus of the stomach. No disproportionate dilatation of bowel. No obvious free intraperitoneal gas. IMPRESSION: Feeding tube tip is in the fundus of the stomach. Electronically Signed   By: Jolaine ClickArthur  Hoss M.D.   On: 01/26/2018 11:43     ASSESSMENT AND PLAN:   78 year old female with history of COPD who presented to the emergency room with abdominal pain and nausea.  1.  Septic shock: Etiology is ischemic versus infectious colitis Continue pressors to keep map greater than 65 Appreciate intensivist consult Continue meropenem for pneumonia and colitis  2.  Acute on chronic kidney disease: Acute kidney injury in the setting of IV contrast, prerenal azotemia and septic shock Nephrology consultation appreciated Continue management as per nephrology Will need outpatient follow-up on renal mass (right side)  3.  Acute hypoxic respiratory failure in the setting of acute on chronic systolic heart failure with ejection fraction 25 to 30% as well as mild COPD exacerbation and pneumonia Weaning steroids  Wean oxygen as tolerated Continue meropenem for pneumonia  4.  Elevated troponin due to demand ischemia: Patient ruled out for ACS 5.  New onset atrial fibrillation in the setting of above issues: Patient responded well to amiodarone  6.  Protein calorie malnutrition: Continue feeding supplement 7.  Mouth sores: Encourage Magic mouthwash,  nystatin and lidocaine   Management plans discussed with the patient'S FAMILY AND THEY ARE in agreement.  CODE STATUS: PARTIAL  TOTAL TIME TAKING CARE OF THIS PATIENT: 25 minutes.     POSSIBLE D/C ??, DEPENDING ON CLINICAL CONDITION.   Dempsey Knotek M.D on 01/27/2018 at 11:34 AM  Between 7am to 6pm - Pager - [phone] After 6pm go to [email or web address] - password [email or web address]  [facility]  Office  [phone]  CC: Primary care physician; Hillery AldoPatel, Sarah, MD  Note: This dictation was prepared with Dragon dictation along with smaller phrase technology. Any transcriptional errors that result from this process are unintentional.

## 2018-01-27 NOTE — Progress Notes (Signed)
Follow up - Critical Care Medicine Note  Patient Details:    Christine Mcclain is an 78 y.o. female.with a past medical history remarkable for hypertension, hyperlipidemia, COPD, asthma, arthritis, was admitted on 12/26 after developing left lower quadrant pain associated with nausea.  She also was passing black stool.  Was admitted and treated for colitis, fluid resuscitation, antibiotics with Cipro and Flagyl.  She was also noted during hospitalization to have a right lower lobe pneumonia, possible melena, acute renal injury, elevated troponin, chronic systolic heart failure with an ejection fraction of 30%, right renal mass being followed.  Rapid response secondary to decreasing blood pressure and loss of IV access. Patient is also having some increasing shortness of breath and is being moved to the intensive care unit for further therapy   Lines, Airways, Drains: CVC Triple Lumen 01/22/18 Right Femoral 20 cm (Active)  Indication for Insertion or Continuance of Line Vasoactive infusions 01/22/2018  8:10 PM  Site Assessment Clean;Dry;Intact 01/22/2018  8:10 PM  Proximal Lumen Status Flushed;Saline locked 01/22/2018  8:10 PM  Medial Lumen Status Infusing;Flushed 01/22/2018  8:10 PM  Distal Lumen Status Flushed;Saline locked 01/22/2018  8:10 PM  Dressing Type Transparent;Occlusive 01/22/2018  8:10 PM  Dressing Status Clean;Dry;Intact;Antimicrobial disc in place 01/22/2018  8:10 PM  Line Care Connections checked and tightened 01/22/2018  8:10 PM  Dressing Intervention New dressing 01/22/2018 11:00 AM  Dressing Change Due 01/29/18 01/22/2018  8:10 PM     Urethral Catheter C Green, RN Double-lumen;Latex 14 Fr. (Active)  Indication for Insertion or Continuance of Catheter Unstable critical patients (first 24-48 hours) 01/23/2018  5:17 AM  Site Assessment Clean;Intact 01/23/2018  5:17 AM  Date Prophylactic Dressing Applied (if applicable) 01/22/18 01/22/2018 11:00 AM  Catheter Maintenance Bag below level  of bladder 01/23/2018  5:17 AM  Collection Container Standard drainage bag 01/23/2018  5:17 AM  Securement Method Securing device (Describe) 01/23/2018  5:17 AM  Output (mL) 315 mL 01/22/2018  4:00 PM    Anti-infectives:  Anti-infectives (From admission, onward)   Start     Dose/Rate Route Frequency Ordered Stop   01/22/18 1130  meropenem (MERREM) 500 mg in sodium chloride 0.9 % 100 mL IVPB     500 mg 200 mL/hr over 30 Minutes Intravenous Every 12 hours 01/22/18 1129     01/19/18 1700  ciprofloxacin (CIPRO) tablet 500 mg  Status:  Discontinued     500 mg Oral Daily with supper 01/19/18 1331 01/22/18 1058   01/19/18 0600  metroNIDAZOLE (FLAGYL) tablet 500 mg  Status:  Discontinued     500 mg Oral Every 8 hours 01/18/18 1442 01/22/18 1058   01/18/18 2000  ciprofloxacin (CIPRO) tablet 250 mg  Status:  Discontinued     250 mg Oral 2 times daily 01/18/18 1442 01/18/18 1628   01/18/18 1800  ciprofloxacin (CIPRO) IVPB 400 mg  Status:  Discontinued     400 mg 200 mL/hr over 60 Minutes Intravenous Every 24 hours 01/18/18 1628 01/19/18 1331   01/18/18 0000  levofloxacin (LEVAQUIN) IVPB 750 mg  Status:  Discontinued     750 mg 100 mL/hr over 90 Minutes Intravenous  Once 01/22/2018 1159 01/06/2018 1159   01/04/2018 1200  ciprofloxacin (CIPRO) IVPB 400 mg  Status:  Discontinued     400 mg 200 mL/hr over 60 Minutes Intravenous Every 12 hours 01/01/2018 1158 01/18/2018 1159   01/13/2018 1200  metroNIDAZOLE (FLAGYL) IVPB 500 mg  Status:  Discontinued     500 mg 100  mL/hr over 60 Minutes Intravenous Every 8 hours 12/27/2017 1158 01/19/2018 1159   01/10/2018 1200  metroNIDAZOLE (FLAGYL) IVPB 500 mg     500 mg 100 mL/hr over 60 Minutes Intravenous Every 8 hours 01/22/2018 1159 01/18/18 2318   01/19/2018 1130  levofloxacin (LEVAQUIN) IVPB 750 mg     750 mg 100 mL/hr over 90 Minutes Intravenous  Once 01/15/2018 1116 01/04/2018 1336      Microbiology: Results for orders placed or performed during the hospital encounter of  01/02/2018  Respiratory Panel by PCR     Status: None   Collection Time: 01/18/18 10:28 AM  Result Value Ref Range Status   Adenovirus NOT DETECTED NOT DETECTED Final   Coronavirus 229E NOT DETECTED NOT DETECTED Final   Coronavirus HKU1 NOT DETECTED NOT DETECTED Final   Coronavirus NL63 NOT DETECTED NOT DETECTED Final   Coronavirus OC43 NOT DETECTED NOT DETECTED Final   Metapneumovirus NOT DETECTED NOT DETECTED Final   Rhinovirus / Enterovirus NOT DETECTED NOT DETECTED Final   Influenza A NOT DETECTED NOT DETECTED Final   Influenza B NOT DETECTED NOT DETECTED Final   Parainfluenza Virus 1 NOT DETECTED NOT DETECTED Final   Parainfluenza Virus 2 NOT DETECTED NOT DETECTED Final   Parainfluenza Virus 3 NOT DETECTED NOT DETECTED Final   Parainfluenza Virus 4 NOT DETECTED NOT DETECTED Final   Respiratory Syncytial Virus NOT DETECTED NOT DETECTED Final   Bordetella pertussis NOT DETECTED NOT DETECTED Final   Chlamydophila pneumoniae NOT DETECTED NOT DETECTED Final   Mycoplasma pneumoniae NOT DETECTED NOT DETECTED Final    Comment: Performed at [facility], [address] [place], [place] [address]  MRSA PCR Screening     Status: None   Collection Time: 01/22/18 10:52 AM  Result Value Ref Range Status   MRSA by PCR NEGATIVE NEGATIVE Final    Comment:        The GeneXpert MRSA Assay (FDA approved for NASAL specimens only), is one component of a comprehensive MRSA colonization surveillance program. It is not intended to diagnose MRSA infection nor to guide or monitor treatment for MRSA infections. Performed at [facility], [address] [place], [place] [address]    Studies: Dg Chest 2 View  Result Date: 01/21/2018 CLINICAL DATA:  Hypoxia. EXAM: CHEST - 2 VIEW COMPARISON:  Chest x-ray 8 01/13/2018.  CT 12/31/2017, 09/05/2017. FINDINGS: Mediastinum and hilar structures are normal. Heart size stable. Left lower lobe atelectasis/consolidation with left-sided pleural  effusion. Small right pleural effusion. Elevation left hemidiaphragm again noted. No pneumothorax contrast noted over the colon. Degenerative change and osteopenia thoracic spine. IMPRESSION: 1. Left lower lobe atelectasis/consolidation with left-sided pleural effusion. Small right pleural effusion. These findings are new from prior exam. 2.  Elevation left hemidiaphragm again noted. Electronically Signed   By: Maisie Fushomas  Register   On: 01/21/2018 13:01   Dg Chest 2 View  Result Date: 12/26/2017 CLINICAL DATA:  Left lower quadrant pain. EXAM: CHEST - 2 VIEW COMPARISON:  Eight/14/19 FINDINGS: Normal heart size. Aortic atherosclerosis. No pleural effusion or edema. No airspace opacities. Asymmetric elevation of left hemidiaphragm is again noted. No acute bone abnormalities noted. Spondylosis identified within the thoracic spine. IMPRESSION: No active cardiopulmonary disease. Aortic Atherosclerosis (ICD10-I70.0). Electronically Signed   By: Signa Kellaylor  Stroud M.D.   On: 12/25/2017 09:14   Dg Abd 1 View  Result Date: 01/27/2018 CLINICAL DATA:  Abdominal pain beginning today during tube feeding. EXAM: ABDOMEN - 1 VIEW COMPARISON:  01/26/2018 and CT  01/02/2018 FINDINGS: Enteric tube is present with tip over the distal stomach just left of midline. Right femoral catheter likely venous in origin with tip in the region of the external iliac vein over the level of the sacroiliac joint. Bowel gas pattern is nonobstructive. Remainder the exam is unchanged. IMPRESSION: Nonobstructive bowel gas pattern. Enteric tube with tip over the distal stomach just left of midline. Electronically Signed   By: Elberta Fortis M.D.   On: 01/27/2018 08:20   Ct Abdomen Pelvis W Contrast  Result Date: 01/19/2018 CLINICAL DATA:  LEFT lower quadrant pain. Nausea. PET-CT 02/16/2016 EXAM: CT ABDOMEN AND PELVIS WITH CONTRAST TECHNIQUE: Multidetector CT imaging of the abdomen and pelvis was performed using the standard protocol following bolus  administration of intravenous contrast. CONTRAST:  94mL OMNIPAQUE IOHEXOL 300 MG/ML  SOLN COMPARISON:  PET-CT 02/16/2016, CT chest 09/05/2017 FINDINGS: Lower chest: Mild nodular airspace disease in the RIGHT lower lobe is new from comparison CT 09/05/2017 Hepatobiliary: No focal hepatic lesion. Mild periportal edema. Postcholecystectomy. Pancreas: Pancreas is normal. No ductal dilatation. No pancreatic inflammation. Spleen: Normal spleen Adrenals/urinary tract: Adrenal glands normal. Benign cyst of the LEFT kidney. No renal obstruction. Exophytic from the lower pole of the RIGHT kidney there is a ovoid high-density mass measuring 2.7 cm with HU equal 70. High-density lesion on comparison noncontrast PET-CT scan of 02/16/2016 at equal density; however, lesion was smaller measuring 2.2 by 1.8 cm. Lesion had no significant metabolic activity at that time. No renal obstruction. There is moderate volume of gas within the bladder collecting non dependently (image 62/6). Stomach/Bowel: Stomach, small-bowel normal. Post appendectomy. There is mild thickening of the ascending colon. Small amount fluid surrounding the ascending colon. Transverse descending colon appear normal. Rectosigmoid colon normal. Vascular/Lymphatic: Abdominal aorta is normal caliber with atherosclerotic calcification. There is no retroperitoneal or periportal lymphadenopathy. No pelvic lymphadenopathy. Reproductive: Post hysterectomy Other: Moderate amount of simple fluid the posterior cul-de-sac. Musculoskeletal: no aggressive osseous lesion. Severe degenerate changes of the lower lumbar spine. IMPRESSION: 1. New nodular airspace disease in the RIGHT lower lobe could represent aspiration pneumonitis or pneumonia. 2. Mild thickening of the ascending colon suggests segmental colitis. Differential would include inflammatory bowel disease, infectious colitis, drug induced colitis, and ischemic colitis. 3. Moderate volume of gas within the bladder.  Recommend correlation with instrumentation. 4. High-density mass exophytic from the lower pole of the RIGHT kidney is indeterminate. Recommend MRI with and without contrast for further evaluation. Electronically Signed   By: Genevive Bi M.D.   On: 01/12/2018 10:51   Dg Chest Port 1 View  Result Date: 01/22/2018 CLINICAL DATA:  Shortness of breath.  Current smoker.  Hypertension. EXAM: PORTABLE CHEST 1 VIEW COMPARISON:  01/21/2018. FINDINGS: LEFT lower lobe atelectasis and consolidation with LEFT pleural effusion is redemonstrated. Unchanged cardiomediastinal silhouette. Calcified tortuous aorta. Mild vascular congestion. IMPRESSION: No active disease. Electronically Signed   By: Elsie Stain M.D.   On: 01/22/2018 07:18   Dg Abd Portable 1v  Result Date: 01/26/2018 CLINICAL DATA:  NG tube placement EXAM: PORTABLE ABDOMEN - 1 VIEW COMPARISON:  01/18/2018 FINDINGS: Feeding tube has been placed. The tip is in the fundus of the stomach. No disproportionate dilatation of bowel. No obvious free intraperitoneal gas. IMPRESSION: Feeding tube tip is in the fundus of the stomach. Electronically Signed   By: Jolaine Click M.D.   On: 01/26/2018 11:43   [place] Ekg Site Rite  Result Date: 01/22/2018 If Site Rite image not attached, placement could not be confirmed  due to current cardiac rhythm.   Consults: Treatment Team:  Lamont Dowdy, MD   Subjective:    Overnight Issues: Patient has been unable to take in adequate calories.  Had significant oral and mouth pain yesterday.  Started on nasogastric tube feeding, nystatin and viscous lidocaine.  Complaining of some abdominal discomfort this morning  Objective:  Vital signs for last 24 hours: Temp:  [97.5 F (36.4 C)-98 F (36.7 C)] 98 F (36.7 C) (01/05 0400) Pulse Rate:  [100-124] 102 (01/05 0853) Resp:  [15-30] 20 (01/05 0853) BP: (79-142)/(47-91) 117/58 (01/05 0600) SpO2:  [72 %-96 %] 96 % (01/05 0853) Weight:  [60.5 kg] 60.5 kg (01/05  0500)  Hemodynamic parameters for last 24 hours:    Intake/Output from previous day: 01/04 0701 - 01/05 0700 In: 1495.1 [I.V.:450.4; NG/GT:840; IV Piggyback:204.7] Out: 711 [Urine:711]  Intake/Output this shift: No intake/output data recorded.  Vent settings for last 24 hours:    Physical Exam:  Vital signs:       Please see the above listed vital signs Patient is awake, responsive and communicating HEENT:           Trachea midline, no oral lesions appreciated, on facemask oxygen Cardiovascular:            Heart rate has significantly improved in the 90s.  Appears to have intermittent sinus mechanism Pulmonary:       Scant basilar crackles Abdominal:      Hypoactive bowel sounds, not distended, no significant point tenderness to palpation Extremities:     Clubbing, cyanosis or edema noted Neurologic:      Patient moves all extremities, no clear focal deficits noted  Assessment/Plan:  Septic shock.  On meropenem.  Likely etiology is colitis ischemic versus infectious.  Patient is on low-dose Neo-Synephrine.  Hemodynamics have improved we will try to wean off neo-today  Respiratory failure.  Presently doing much better.  Bronchospasm resolved.  On nasal cannula, will start to de-escalate Solu-Medrol  Acute renal failure.    Patient had 711 cc of urine output yesterday with Lasix.  BMP shows BUN 104, creatinine 3.35, potassium 4.7, CO2 is stable at 17, anion gap of 10  Atrial fibrillation.  On amiodarone, has intermittent bouts of atrial fibrillation presently in atrial fibrillation with a ventricular response of 100  Abdominal pain.  KUB revealed nonobstructive gas pattern.  We will continue nasogastric tube feedings as tolerated  Leukocytosis.    White count is 22.3, on meropenem  Heart failure with reduced ejection fraction  Right renal mass.  Will need furthere valuation in the outpatient setting  Tora Kindred, DO  Carsynn Bethune 01/27/2018  *Care during the described  time interval was provided by me and/or other providers on the critical care team.  I have reviewed this patient's available data, including medical history, events of note, physical examination and test results as part of my evaluation. Patient ID: JADON ALWARD, female   DOB: November 04, 1940, 78 y.o.   MRN: [id number] Patient ID: YARETZY VANHORN, female   DOB: 03/10/1940, 78 y.o.   MRN: [id number] Patient ID: ZOELYNN PICKNEY, female   DOB: 04/19/1940, 78 y.o.   MRN: [id number] Patient ID: YIZEL SELVAGGIO, female   DOB: [birth year], 78 y.o.   MRN: [id number]

## 2018-01-27 NOTE — Consult Note (Signed)
Pharmacy Antibiotic Note  Christine Mcclain is a 78 y.o. female admitted on Feb 06, 2018 with Colitis. She was started on Cipro/Flagyl on admission. 12/31 pharmacy consulted for meropenem dosing for intra-abdominal infection.   Plan: Meropenem 500mg  every 12 hours.   Height: 5\' 2"  (157.5 cm) Weight: 133 lb 6.1 oz (60.5 kg) IBW/kg (Calculated) : 50.1  Temp (24hrs), Avg:97.6 F (36.4 C), Min:97.4 F (36.3 C), Max:98 F (36.7 C)  Recent Labs  Lab 01/21/18 0532  01/22/18 0837 01/22/18 1052 01/23/18 0606 01/23/18 1909 01/24/18 0037 01/25/18 1000 01/26/18 0002 01/26/18 1332 01/27/18 0503  WBC 13.9*  --  15.4* 14.5* 15.6*  --   --   --   --  22.3*  --   CREATININE 2.21*   < > 2.82*  --  3.05* 3.36* 3.38* 3.44* 3.45*  --  3.35*  LATICACIDVEN  --   --  1.4 1.2  --   --   --   --   --   --   --    < > = values in this interval not displayed.    Estimated Creatinine Clearance: 11.7 mL/min (A) (by C-G formula based on SCr of 3.35 mg/dL (H)).    Allergies  Allergen Reactions  . Gabapentin Hives  . Naproxen Hives  . Penicillins Hives    Has patient had a PCN reaction causing immediate rash, facial/tongue/throat swelling, SOB or lightheadedness with hypotension: No Has patient had a PCN reaction causing severe rash involving mucus membranes or skin necrosis: No Has patient had a PCN reaction that required hospitalization: No Has patient had a PCN reaction occurring within the last 10 years: No If all of the above answers are "NO", then may proceed with Cephalosporin use.  . Codeine Rash    Antimicrobials this admission: 12/26 Flagyl  >>  12/31  12/27 Cipro  >> 12/31  12/31 meropenem >>    Microbiology results: 12/27: Resp Panel PCR (-)  12/31 MRSA PCR (-)   Thank you for allowing pharmacy to be a part of this patient's care.  Simpson,Michael L 01/27/2018 3:54 PM

## 2018-01-27 NOTE — Progress Notes (Signed)
[facility]  ROUNDING NOTE   Subjective:   Husband and daughter at bedside.  UOP 711mL (346mL).   BUN 105 (100)  NGT placed for feeding yesterday.   Patient states she subjectively feels better but unable to elaborate.   Objective:  Vital signs in last 24 hours:  Temp:  [97.5 F (36.4 C)-98 F (36.7 C)] 97.7 F (36.5 C) (01/05 0800) Pulse Rate:  [100-124] 102 (01/05 0853) Resp:  [15-30] 20 (01/05 0853) BP: (79-123)/(47-74) 117/58 (01/05 0600) SpO2:  [88 %-96 %] 88 % (01/05 1000) Weight:  [60.5 kg] 60.5 kg (01/05 0500)  Weight change: 0.9 kg Filed Weights   01/25/18 0500 01/26/18 0402 01/27/18 0500  Weight: 59.2 kg 59.6 kg 60.5 kg    Intake/Output: I/O last 3 completed shifts: In: 1778.3 [I.V.:725.4; NG/GT:840; IV Piggyback:212.9] Out: 857 [Urine:857]   Intake/Output this shift:  Total I/O In: 60 [NG/GT:60] Out: 160 [Urine:160]  Physical Exam: General: Critically ill,   Head: [place]/AT    Eyes: Anicteric, PERRL  Neck: Supple, trachea midline  Lungs:  Clear, 4L [place]  Heart: irregular  Abdomen:  soft  Extremities:  no peripheral edema.  Neurologic: Nonfocal, moving all four extremities  Skin: No lesions        Basic Metabolic Panel: Recent Labs  Lab 01/22/18 0837  01/23/18 1909 01/24/18 0037 01/25/18 1000 01/26/18 0002 01/27/18 0503  NA 132*   < > 134* 134* 135 134* 136  K 5.1   < > 5.3* 5.0 5.1 5.1 4.7  CL 108   < > 110 109 107 108 109  CO2 18*   < > 16* 17* 17* 17* 17*  GLUCOSE 107*   < > 139* 169* 149* 151* 205*  BUN 70*   < > 83* 85* 96* 100* 104*  CREATININE 2.82*   < > 3.36* 3.38* 3.44* 3.45* 3.35*  CALCIUM 8.4*   < > 8.6* 8.6* 8.0* 7.8* 7.9*  MG 2.1  --   --   --   --   --   --   PHOS 6.4*  --   --   --  6.7* 6.5* 5.9*   < > = values in this interval not displayed.    Liver Function Tests: Recent Labs  Lab 01/22/18 0837 01/25/18 1000 01/26/18 0002 01/27/18 0503  AST 205*  --   --   --   ALT 66*  --   --   --   ALKPHOS  41  --   --   --   BILITOT 0.8  --   --   --   PROT 4.4*  --   --   --   ALBUMIN 1.9* 2.4* 2.6* 2.3*   No results for input(s): LIPASE, AMYLASE in the last 168 hours. No results for input(s): AMMONIA in the last 168 hours.  CBC: Recent Labs  Lab 01/21/18 0532 01/22/18 0837 01/22/18 1052 01/23/18 0606 01/26/18 1332  WBC 13.9* 15.4* 14.5* 15.6* 22.3*  NEUTROABS 11.5* 12.9* 12.3*  --  20.9*  HGB 14.5 12.8 13.6 14.0 13.3  HCT 45.1 39.5 42.0 43.5 39.5  MCV 101.8* 101.8* 101.7* 102.1* 96.8  PLT PLATELET CLUMPS NOTED ON SMEAR, UNABLE TO ESTIMATE 130* 119* 118* 115*    Cardiac Enzymes: Recent Labs  Lab 01/22/18 0706  TROPONINI 0.12*    BNP: Invalid input(s): POCBNP  CBG: Recent Labs  Lab 01/26/18 1932 01/26/18 2320 01/27/18 0321 01/27/18 0453 01/27/18 0731  GLUCAP 155* 196* 198* 199* 185*  Microbiology: Results for orders placed or performed during the hospital encounter of 01/08/2018  Respiratory Panel by PCR     Status: None   Collection Time: 01/18/18 10:28 AM  Result Value Ref Range Status   Adenovirus NOT DETECTED NOT DETECTED Final   Coronavirus 229E NOT DETECTED NOT DETECTED Final   Coronavirus HKU1 NOT DETECTED NOT DETECTED Final   Coronavirus NL63 NOT DETECTED NOT DETECTED Final   Coronavirus OC43 NOT DETECTED NOT DETECTED Final   Metapneumovirus NOT DETECTED NOT DETECTED Final   Rhinovirus / Enterovirus NOT DETECTED NOT DETECTED Final   Influenza A NOT DETECTED NOT DETECTED Final   Influenza B NOT DETECTED NOT DETECTED Final   Parainfluenza Virus 1 NOT DETECTED NOT DETECTED Final   Parainfluenza Virus 2 NOT DETECTED NOT DETECTED Final   Parainfluenza Virus 3 NOT DETECTED NOT DETECTED Final   Parainfluenza Virus 4 NOT DETECTED NOT DETECTED Final   Respiratory Syncytial Virus NOT DETECTED NOT DETECTED Final   Bordetella pertussis NOT DETECTED NOT DETECTED Final   Chlamydophila pneumoniae NOT DETECTED NOT DETECTED Final   Mycoplasma pneumoniae NOT  DETECTED NOT DETECTED Final    Comment: Performed at [facility], [address] [place], [place] [address]  MRSA PCR Screening     Status: None   Collection Time: 01/22/18 10:52 AM  Result Value Ref Range Status   MRSA by PCR NEGATIVE NEGATIVE Final    Comment:        The GeneXpert MRSA Assay (FDA approved for NASAL specimens only), is one component of a comprehensive MRSA colonization surveillance program. It is not intended to diagnose MRSA infection nor to guide or monitor treatment for MRSA infections. Performed at [facility], [address] [place], [place] [address]     Coagulation Studies: No results for input(s): LABPROT, INR in the last 72 hours.  Urinalysis: No results for input(s): COLORURINE, LABSPEC, PHURINE, GLUCOSEU, HGBUR, BILIRUBINUR, KETONESUR, PROTEINUR, UROBILINOGEN, NITRITE, LEUKOCYTESUR in the last 72 hours.  Invalid input(s): APPERANCEUR    Imaging: Dg Abd 1 View  Result Date: 01/27/2018 CLINICAL DATA:  Abdominal pain beginning today during tube feeding. EXAM: ABDOMEN - 1 VIEW COMPARISON:  01/26/2018 and CT 01/20/2018 FINDINGS: Enteric tube is present with tip over the distal stomach just left of midline. Right femoral catheter likely venous in origin with tip in the region of the external iliac vein over the level of the sacroiliac joint. Bowel gas pattern is nonobstructive. Remainder the exam is unchanged. IMPRESSION: Nonobstructive bowel gas pattern. Enteric tube with tip over the distal stomach just left of midline. Electronically Signed   By: Elberta Fortis M.D.   On: 01/27/2018 08:20   Dg Abd Portable 1v  Result Date: 01/26/2018 CLINICAL DATA:  NG tube placement EXAM: PORTABLE ABDOMEN - 1 VIEW COMPARISON:  12/31/2017 FINDINGS: Feeding tube has been placed. The tip is in the fundus of the stomach. No disproportionate dilatation of bowel. No obvious free intraperitoneal gas. IMPRESSION: Feeding tube tip is in the fundus of the stomach.  Electronically Signed   By: Jolaine Click M.D.   On: 01/26/2018 11:43     Medications:   . amiodarone 30 mg/hr (01/27/18 0956)  . dexmedetomidine (PRECEDEX) IV infusion Stopped (01/25/18 0552)  . feeding supplement (JEVITY 1.2 CAL) 50 mL/hr at 01/26/18 1800  . meropenem (MERREM) IV 500 mg (01/27/18 1001)  . phenylephrine (NEO-SYNEPHRINE) Adult infusion 8 mcg/min (01/27/18 0600)   . budesonide (PULMICORT) nebulizer solution  0.5 mg Nebulization BID  .  feeding supplement  1 Container Oral TID BM  . furosemide  40 mg Intravenous Daily  . insulin aspart  0-9 Units Subcutaneous Q4H  . ipratropium-albuterol  3 mL Nebulization Q6H  . magic mouthwash  5 mL Oral QID   And  . lidocaine  5 mL Mouth/Throat TID  . mouth rinse  15 mL Mouth Rinse BID  . methylPREDNISolone (SOLU-MEDROL) injection  60 mg Intravenous Q12H  . nystatin  5 mL Oral QID  . pantoprazole (PROTONIX) IV  40 mg Intravenous Q24H  . sennosides  5 mL Oral BID  . sodium chloride flush  10-40 mL Intracatheter Q12H  . vitamin C  250 mg Oral BID   acetaminophen **OR** acetaminophen, bisacodyl, HYDROcodone-acetaminophen, ipratropium-albuterol, morphine injection, ondansetron **OR** ondansetron (ZOFRAN) IV, phenol, sodium chloride flush, traZODone  Assessment/ Plan:  [name] is a 78 y.o. white female with COPD, hypertension, hyperlipidemia, osteoarthritis, osteopenia, systolic congestive heart failure EF 30% who was admitted to [facility] on 01/14/2018 for colitis, pneumonia and acute renal failure  1. Acute kidney failure on chronic kidney disease stage III: baseline creatinine of 1.18, GFR of 43 on 08/15/17.  2. Metabolic acidosis 3. Hyponatremia  4. Hyperkalemia   Plan Electrolytes improved with loop diuretics.  Nonoliguric. Responding to IV furosemide However with worsening azotemia.  Continue furosemide  Discussed dialysis with family and patient. Willing to try if no improvement.   5. Hematuria 6. Renal Mass right -  incidental finding.  - Needs outpatient follow up on renal mass.    LOS: 10 Freja Faro 1/5/202010:41 AM

## 2018-01-28 DIAGNOSIS — J9621 Acute and chronic respiratory failure with hypoxia: Secondary | ICD-10-CM

## 2018-01-28 DIAGNOSIS — K529 Noninfective gastroenteritis and colitis, unspecified: Secondary | ICD-10-CM

## 2018-01-28 DIAGNOSIS — R579 Shock, unspecified: Secondary | ICD-10-CM

## 2018-01-28 LAB — GLUCOSE, CAPILLARY
Glucose-Capillary: 147 mg/dL — ABNORMAL HIGH (ref 70–99)
Glucose-Capillary: 154 mg/dL — ABNORMAL HIGH (ref 70–99)
Glucose-Capillary: 145 mg/dL — ABNORMAL HIGH (ref 70–99)
Glucose-Capillary: 175 mg/dL — ABNORMAL HIGH (ref 70–99)
Glucose-Capillary: 158 mg/dL — ABNORMAL HIGH (ref 70–99)

## 2018-01-28 LAB — BASIC METABOLIC PANEL
Anion gap: 9 (ref 5–15)
BUN: 113 mg/dL — ABNORMAL HIGH (ref 8–23)
CO2: 18 mmol/L — ABNORMAL LOW (ref 22–32)
Calcium: 8 mg/dL — ABNORMAL LOW (ref 8.9–10.3)
Chloride: 112 mmol/L — ABNORMAL HIGH (ref 98–111)
Creatinine, Ser: 3.1 mg/dL — ABNORMAL HIGH (ref 0.44–1.00)
GFR calc Af Amer: 16 mL/min — ABNORMAL LOW (ref >60)
GFR calc non Af Amer: 14 mL/min — ABNORMAL LOW (ref >60)
Glucose, Bld: 158 mg/dL — ABNORMAL HIGH (ref 70–99)
Potassium: 5 mmol/L (ref 3.5–5.1)
Sodium: 139 mmol/L (ref 135–145)

## 2018-01-28 LAB — CBC
HCT: 40.2 % (ref 36.0–46.0)
Hemoglobin: 13.7 g/dL (ref 12.0–15.0)
MCH: 33.1 pg (ref 26.0–34.0)
MCHC: 34.1 g/dL (ref 30.0–36.0)
MCV: 97.1 fL (ref 80.0–100.0)
Platelets: UNDETERMINED 10*3/uL (ref 150–400)
RBC: 4.14 MIL/uL (ref 3.87–5.11)
RDW: 15.9 % — ABNORMAL HIGH (ref 11.5–15.5)
WBC: 26.5 10*3/uL — ABNORMAL HIGH (ref 4.0–10.5)
nRBC: 0 % (ref 0.0–0.2)

## 2018-01-28 LAB — LACTIC ACID, PLASMA: Lactic Acid, Venous: 1.1 mmol/L (ref 0.5–1.9)

## 2018-01-28 MED ORDER — NOREPINEPHRINE 4 MG/250ML-% IV SOLN
0.0000 ug/min | INTRAVENOUS | Status: DC
  Filled 2018-01-28 (×3): qty 250

## 2018-01-28 MED ORDER — NOREPINEPHRINE BITARTRATE 1 MG/ML IV SOLN
0.0000 ug/min | INTRAVENOUS | Status: DC
  Administered 2018-01-28: 8 ug/min via INTRAVENOUS
  Administered 2018-01-29: 12 ug/min via INTRAVENOUS
  Administered 2018-01-29: 10 ug/min via INTRAVENOUS
  Filled 2018-01-28 (×4): qty 4

## 2018-01-28 MED ORDER — NOREPINEPHRINE BITARTRATE 1 MG/ML IV SOLN
0.0000 ug/min | INTRAVENOUS | Status: DC
  Administered 2018-01-28: 4 ug/min via INTRAVENOUS
  Filled 2018-01-28 (×2): qty 4

## 2018-01-28 NOTE — Consult Note (Signed)
Pharmacy Antibiotic Note  Christine Mcclain is a 78 y.o. female admitted on 12/31/2017 with Colitis. She was started on Cipro/Flagyl on admission. 12/31 pharmacy consulted for meropenem dosing for intra-abdominal infection.   Plan: Meropenem 500mg  every 12 hours.   Height: 5\' 2"  (157.5 cm) Weight: 132 lb 11.5 oz (60.2 kg) IBW/kg (Calculated) : 50.1  Temp (24hrs), Avg:97.5 F (36.4 C), Min:97.3 F (36.3 C), Max:97.8 F (36.6 C)  Recent Labs  Lab 01/22/18 0837 01/22/18 1052 01/23/18 0606  01/25/18 1000 01/26/18 0002 01/26/18 1332 01/27/18 0503 01/27/18 2126 01/28/18 0406 01/28/18 1129  WBC 15.4* 14.5* 15.6*  --   --   --  22.3*  --  19.3* 26.5*  --   CREATININE 2.82*  --  3.05*   < > 3.44* 3.45*  --  3.35* 3.29* 3.10*  --   LATICACIDVEN 1.4 1.2  --   --   --   --   --   --   --   --  1.1   < > = values in this interval not displayed.    Estimated Creatinine Clearance: 12.6 mL/min (A) (by C-G formula based on SCr of 3.1 mg/dL (H)).    Allergies  Allergen Reactions  . Gabapentin Hives  . Naproxen Hives  . Penicillins Hives    Has patient had a PCN reaction causing immediate rash, facial/tongue/throat swelling, SOB or lightheadedness with hypotension: No Has patient had a PCN reaction causing severe rash involving mucus membranes or skin necrosis: No Has patient had a PCN reaction that required hospitalization: No Has patient had a PCN reaction occurring within the last 10 years: No If all of the above answers are "NO", then may proceed with Cephalosporin use.  . Codeine Rash    Antimicrobials this admission: 12/26 Flagyl  >>  12/31  12/27 Cipro  >> 12/31  12/31 meropenem >>    Microbiology results: 12/27: Resp Panel PCR (-)  12/31 MRSA PCR (-)   Thank you for allowing pharmacy to be a part of this patient's care.  Gardner Candle, PharmD, BCPS Clinical Pharmacist 01/28/2018 3:00 PM

## 2018-01-28 NOTE — Progress Notes (Signed)
[facility] [place] [facility]   PATIENT NAME: Christine Mcclain    MR#:  [id number]  DATE OF BIRTH:  10/22/1940  SUBJECTIVE:  Family at bedside.  Patient still unable to swallow.  REVIEW OF SYSTEMS:    Review of Systems  Constitutional: Positive for malaise/fatigue. Negative for chills, fever and weight loss.  HENT: Negative.  Negative for ear discharge, ear pain, hearing loss, nosebleeds and sore throat.        Mouth sores  Eyes: Negative.  Negative for blurred vision and pain.  Respiratory: Negative.  Negative for cough, hemoptysis, shortness of breath and wheezing.   Cardiovascular: Negative.  Negative for chest pain, palpitations and leg swelling.  Gastrointestinal: Negative for abdominal pain, blood in stool, diarrhea, nausea and vomiting.  Genitourinary: Negative.  Negative for dysuria.  Musculoskeletal: Negative.  Negative for back pain.  Skin: Negative.   Neurological: Negative for dizziness, tremors, speech change, focal weakness, seizures and headaches.  Endo/Heme/Allergies: Negative.  Does not bruise/bleed easily.  Psychiatric/Behavioral: Negative.  Negative for depression, hallucinations and suicidal ideas.    Tolerating Diet: NGT      DRUG ALLERGIES:   Allergies  Allergen Reactions  . Gabapentin Hives  . Naproxen Hives  . Penicillins Hives    Has patient had a PCN reaction causing immediate rash, facial/tongue/throat swelling, SOB or lightheadedness with hypotension: No Has patient had a PCN reaction causing severe rash involving mucus membranes or skin necrosis: No Has patient had a PCN reaction that required hospitalization: No Has patient had a PCN reaction occurring within the last 10 years: No If all of the above answers are "NO", then may proceed with Cephalosporin use.  . Codeine Rash    VITALS:  Blood pressure (!) 98/52, pulse (!) 102, temperature (!) 97.4 F (36.3 C), temperature source Oral, resp. rate 19, height 5\' 2"  (1.575 m),  weight 60.2 kg, SpO2 96 %.  PHYSICAL EXAMINATION:  Constitutional: Appears  frail.  Critically ill-appearing HENT: Normocephalic. . NG tube placed Eyes: Conjunctivae are normal. no scleral icterus.  Neck: Normal ROM. Neck supple. No JVD. No tracheal deviation. CVS: irr, irr tachy no murmurs, no gallops, no carotid bruit.  Pulmonary: Normal respiratory effort with bilateral rails.  Abdominal: Positive bowel sounds no distension, mild RLQ tenderness,no rebound or guarding.  Musculoskeletal: Normal range of motion. No edema and no tenderness.  Neuro: Patient sleeping Skin: Skin is warm and dry. No rash noted.      LABORATORY PANEL:   CBC Recent Labs  Lab 01/28/18 0406  WBC 26.5*  HGB 13.7  HCT 40.2  PLT PLATELET CLUMPS NOTED ON SMEAR, UNABLE TO ESTIMATE   ------------------------------------------------------------------------------------------------------------------  Chemistries  Recent Labs  Lab 01/27/18 2126 01/28/18 0406  NA 137 139  K 5.0 5.0  CL 111 112*  CO2 19* 18*  GLUCOSE 180* 158*  BUN 115* 113*  CREATININE 3.29* 3.10*  CALCIUM 8.1* 8.0*  MG 2.3  --   AST 115*  --   ALT 47*  --   ALKPHOS 52  --   BILITOT 0.5  --    ------------------------------------------------------------------------------------------------------------------  Cardiac Enzymes Recent Labs  Lab 01/22/18 0706  TROPONINI 0.12*   ------------------------------------------------------------------------------------------------------------------  RADIOLOGY:  Dg Abd 1 View  Result Date: 01/27/2018 CLINICAL DATA:  Abdominal pain beginning today during tube feeding. EXAM: ABDOMEN - 1 VIEW COMPARISON:  01/26/2018 and CT 01/13/2018 FINDINGS: Enteric tube is present with tip over the distal stomach just left of midline. Right femoral catheter likely  venous in origin with tip in the region of the external iliac vein over the level of the sacroiliac joint. Bowel gas pattern is nonobstructive.  Remainder the exam is unchanged. IMPRESSION: Nonobstructive bowel gas pattern. Enteric tube with tip over the distal stomach just left of midline. Electronically Signed   By: Elberta Fortis M.D.   On: 01/27/2018 08:20     ASSESSMENT AND PLAN:   78 year old female with history of COPD who presented to the emergency room with abdominal pain and nausea.  1.  Septic shock: Etiology is ischemic versus infectious colitis Patient is off of pressors.   Appreciate intensivist consult Continue meropenem for pneumonia and colitis  2.  Acute on chronic kidney disease: Acute kidney injury in the setting of IV contrast, prerenal azotemia and septic shock Nephrology consultation appreciated Continue management as per nephrology Will need outpatient follow-up on renal mass (right side)  3.  Acute hypoxic respiratory failure in the setting of acute on chronic systolic heart failure with ejection fraction 25 to 30% as well as mild COPD exacerbation and pneumonia Weaning steroids Continue Lasix Wean oxygen as tolerated Continue meropenem for pneumonia Follow-up on echocardiogram 4.  Elevated troponin due to demand ischemia: Patient ruled out for ACS 5.  New onset atrial fibrillation in the setting of above issues: Continue amiodarone  6.  Protein calorie malnutrition: Continue feeding supplement 7.  Mouth sores: Encourage Magic mouthwash, nystatin and lidocaine   Management plans discussed with the patient'S FAMILY AND THEY ARE in agreement.  CODE STATUS: PARTIAL  TOTAL TIME TAKING CARE OF THIS PATIENT: 25 minutes.     POSSIBLE D/C ??, DEPENDING ON CLINICAL CONDITION.   Kratos Ruscitti M.D on 01/28/2018 at 12:25 PM  Between 7am to 6pm - Pager - [phone] After 6pm go to [email or web address] - password [email or web address]  [facility]  Office  [phone]  CC: Primary care physician; Hillery Aldo, MD  Note: This dictation was prepared with Dragon dictation along with smaller phrase  technology. Any transcriptional errors that result from this process are unintentional.

## 2018-01-28 NOTE — Progress Notes (Signed)
RT came to room for pt desat to mid 80's. Pt and family strongly expressed desire not to go on BiPAP again, Pt does not tolerate. Pt put on HHFNC 35L 33%

## 2018-01-28 NOTE — Progress Notes (Addendum)
Follow up - Critical Care Medicine Note  Patient Details:    Christine Mcclain is an 78 y.o. female.with a past medical history remarkable for hypertension, hyperlipidemia, COPD, asthma, arthritis, was admitted on 12/26 after developing left lower quadrant pain associated with nausea.  She also was passing black stool.  Was admitted and treated for colitis, fluid resuscitation, antibiotics with Cipro and Flagyl.  She was also noted during hospitalization to have a right lower lobe pneumonia, possible melena, acute renal injury, elevated troponin, chronic systolic heart failure with an ejection fraction of 30%, right renal mass being followed.  Rapid response secondary to decreasing blood pressure and loss of IV access. Patient is also having some increasing shortness of breath and is being moved to the intensive care unit for further therapy   Lines, Airways, Drains: CVC Triple Lumen 01/22/18 Right Femoral 20 cm (Active)  Indication for Insertion or Continuance of Line Vasoactive infusions 01/22/2018  8:10 PM  Site Assessment Clean;Dry;Intact 01/22/2018  8:10 PM  Proximal Lumen Status Flushed;Saline locked 01/22/2018  8:10 PM  Medial Lumen Status Infusing;Flushed 01/22/2018  8:10 PM  Distal Lumen Status Flushed;Saline locked 01/22/2018  8:10 PM  Dressing Type Transparent;Occlusive 01/22/2018  8:10 PM  Dressing Status Clean;Dry;Intact;Antimicrobial disc in place 01/22/2018  8:10 PM  Line Care Connections checked and tightened 01/22/2018  8:10 PM  Dressing Intervention New dressing 01/22/2018 11:00 AM  Dressing Change Due 01/29/18 01/22/2018  8:10 PM     Urethral Catheter C Green, RN Double-lumen;Latex 14 Fr. (Active)  Indication for Insertion or Continuance of Catheter Unstable critical patients (first 24-48 hours) 01/23/2018  5:17 AM  Site Assessment Clean;Intact 01/23/2018  5:17 AM  Date Prophylactic Dressing Applied (if applicable) 01/22/18 01/22/2018 11:00 AM  Catheter Maintenance Bag below level  of bladder 01/23/2018  5:17 AM  Collection Container Standard drainage bag 01/23/2018  5:17 AM  Securement Method Securing device (Describe) 01/23/2018  5:17 AM  Output (mL) 315 mL 01/22/2018  4:00 PM    Anti-infectives:  Anti-infectives (From admission, onward)   Start     Dose/Rate Route Frequency Ordered Stop   01/22/18 1130  meropenem (MERREM) 500 mg in sodium chloride 0.9 % 100 mL IVPB     500 mg 200 mL/hr over 30 Minutes Intravenous Every 12 hours 01/22/18 1129     01/19/18 1700  ciprofloxacin (CIPRO) tablet 500 mg  Status:  Discontinued     500 mg Oral Daily with supper 01/19/18 1331 01/22/18 1058   01/19/18 0600  metroNIDAZOLE (FLAGYL) tablet 500 mg  Status:  Discontinued     500 mg Oral Every 8 hours 01/18/18 1442 01/22/18 1058   01/18/18 2000  ciprofloxacin (CIPRO) tablet 250 mg  Status:  Discontinued     250 mg Oral 2 times daily 01/18/18 1442 01/18/18 1628   01/18/18 1800  ciprofloxacin (CIPRO) IVPB 400 mg  Status:  Discontinued     400 mg 200 mL/hr over 60 Minutes Intravenous Every 24 hours 01/18/18 1628 01/19/18 1331   01/18/18 0000  levofloxacin (LEVAQUIN) IVPB 750 mg  Status:  Discontinued     750 mg 100 mL/hr over 90 Minutes Intravenous  Once 01/22/2018 1159 01/06/2018 1159   01/04/2018 1200  ciprofloxacin (CIPRO) IVPB 400 mg  Status:  Discontinued     400 mg 200 mL/hr over 60 Minutes Intravenous Every 12 hours 01/01/2018 1158 01/18/2018 1159   01/13/2018 1200  metroNIDAZOLE (FLAGYL) IVPB 500 mg  Status:  Discontinued     500 mg 100  mL/hr over 60 Minutes Intravenous Every 8 hours 12/27/2017 1158 01/19/2018 1159   01/10/2018 1200  metroNIDAZOLE (FLAGYL) IVPB 500 mg     500 mg 100 mL/hr over 60 Minutes Intravenous Every 8 hours 01/22/2018 1159 01/18/18 2318   01/19/2018 1130  levofloxacin (LEVAQUIN) IVPB 750 mg     750 mg 100 mL/hr over 90 Minutes Intravenous  Once 01/15/2018 1116 01/04/2018 1336      Microbiology: Results for orders placed or performed during the hospital encounter of  01/02/2018  Respiratory Panel by PCR     Status: None   Collection Time: 01/18/18 10:28 AM  Result Value Ref Range Status   Adenovirus NOT DETECTED NOT DETECTED Final   Coronavirus 229E NOT DETECTED NOT DETECTED Final   Coronavirus HKU1 NOT DETECTED NOT DETECTED Final   Coronavirus NL63 NOT DETECTED NOT DETECTED Final   Coronavirus OC43 NOT DETECTED NOT DETECTED Final   Metapneumovirus NOT DETECTED NOT DETECTED Final   Rhinovirus / Enterovirus NOT DETECTED NOT DETECTED Final   Influenza A NOT DETECTED NOT DETECTED Final   Influenza B NOT DETECTED NOT DETECTED Final   Parainfluenza Virus 1 NOT DETECTED NOT DETECTED Final   Parainfluenza Virus 2 NOT DETECTED NOT DETECTED Final   Parainfluenza Virus 3 NOT DETECTED NOT DETECTED Final   Parainfluenza Virus 4 NOT DETECTED NOT DETECTED Final   Respiratory Syncytial Virus NOT DETECTED NOT DETECTED Final   Bordetella pertussis NOT DETECTED NOT DETECTED Final   Chlamydophila pneumoniae NOT DETECTED NOT DETECTED Final   Mycoplasma pneumoniae NOT DETECTED NOT DETECTED Final    Comment: Performed at [facility], [address] [place], [place] [address]  MRSA PCR Screening     Status: None   Collection Time: 01/22/18 10:52 AM  Result Value Ref Range Status   MRSA by PCR NEGATIVE NEGATIVE Final    Comment:        The GeneXpert MRSA Assay (FDA approved for NASAL specimens only), is one component of a comprehensive MRSA colonization surveillance program. It is not intended to diagnose MRSA infection nor to guide or monitor treatment for MRSA infections. Performed at [facility], [address] [place], [place] [address]    Studies: Dg Chest 2 View  Result Date: 01/21/2018 CLINICAL DATA:  Hypoxia. EXAM: CHEST - 2 VIEW COMPARISON:  Chest x-ray 8 01/13/2018.  CT 12/31/2017, 09/05/2017. FINDINGS: Mediastinum and hilar structures are normal. Heart size stable. Left lower lobe atelectasis/consolidation with left-sided pleural  effusion. Small right pleural effusion. Elevation left hemidiaphragm again noted. No pneumothorax contrast noted over the colon. Degenerative change and osteopenia thoracic spine. IMPRESSION: 1. Left lower lobe atelectasis/consolidation with left-sided pleural effusion. Small right pleural effusion. These findings are new from prior exam. 2.  Elevation left hemidiaphragm again noted. Electronically Signed   By: Maisie Fushomas  Register   On: 01/21/2018 13:01   Dg Chest 2 View  Result Date: 12/26/2017 CLINICAL DATA:  Left lower quadrant pain. EXAM: CHEST - 2 VIEW COMPARISON:  Eight/14/19 FINDINGS: Normal heart size. Aortic atherosclerosis. No pleural effusion or edema. No airspace opacities. Asymmetric elevation of left hemidiaphragm is again noted. No acute bone abnormalities noted. Spondylosis identified within the thoracic spine. IMPRESSION: No active cardiopulmonary disease. Aortic Atherosclerosis (ICD10-I70.0). Electronically Signed   By: Signa Kellaylor  Stroud M.D.   On: 12/25/2017 09:14   Dg Abd 1 View  Result Date: 01/27/2018 CLINICAL DATA:  Abdominal pain beginning today during tube feeding. EXAM: ABDOMEN - 1 VIEW COMPARISON:  01/26/2018 and CT  01/02/2018 FINDINGS: Enteric tube is present with tip over the distal stomach just left of midline. Right femoral catheter likely venous in origin with tip in the region of the external iliac vein over the level of the sacroiliac joint. Bowel gas pattern is nonobstructive. Remainder the exam is unchanged. IMPRESSION: Nonobstructive bowel gas pattern. Enteric tube with tip over the distal stomach just left of midline. Electronically Signed   By: Elberta Fortis M.D.   On: 01/27/2018 08:20   Ct Abdomen Pelvis W Contrast  Result Date: 01/19/2018 CLINICAL DATA:  LEFT lower quadrant pain. Nausea. PET-CT 02/16/2016 EXAM: CT ABDOMEN AND PELVIS WITH CONTRAST TECHNIQUE: Multidetector CT imaging of the abdomen and pelvis was performed using the standard protocol following bolus  administration of intravenous contrast. CONTRAST:  94mL OMNIPAQUE IOHEXOL 300 MG/ML  SOLN COMPARISON:  PET-CT 02/16/2016, CT chest 09/05/2017 FINDINGS: Lower chest: Mild nodular airspace disease in the RIGHT lower lobe is new from comparison CT 09/05/2017 Hepatobiliary: No focal hepatic lesion. Mild periportal edema. Postcholecystectomy. Pancreas: Pancreas is normal. No ductal dilatation. No pancreatic inflammation. Spleen: Normal spleen Adrenals/urinary tract: Adrenal glands normal. Benign cyst of the LEFT kidney. No renal obstruction. Exophytic from the lower pole of the RIGHT kidney there is a ovoid high-density mass measuring 2.7 cm with HU equal 70. High-density lesion on comparison noncontrast PET-CT scan of 02/16/2016 at equal density; however, lesion was smaller measuring 2.2 by 1.8 cm. Lesion had no significant metabolic activity at that time. No renal obstruction. There is moderate volume of gas within the bladder collecting non dependently (image 62/6). Stomach/Bowel: Stomach, small-bowel normal. Post appendectomy. There is mild thickening of the ascending colon. Small amount fluid surrounding the ascending colon. Transverse descending colon appear normal. Rectosigmoid colon normal. Vascular/Lymphatic: Abdominal aorta is normal caliber with atherosclerotic calcification. There is no retroperitoneal or periportal lymphadenopathy. No pelvic lymphadenopathy. Reproductive: Post hysterectomy Other: Moderate amount of simple fluid the posterior cul-de-sac. Musculoskeletal: no aggressive osseous lesion. Severe degenerate changes of the lower lumbar spine. IMPRESSION: 1. New nodular airspace disease in the RIGHT lower lobe could represent aspiration pneumonitis or pneumonia. 2. Mild thickening of the ascending colon suggests segmental colitis. Differential would include inflammatory bowel disease, infectious colitis, drug induced colitis, and ischemic colitis. 3. Moderate volume of gas within the bladder.  Recommend correlation with instrumentation. 4. High-density mass exophytic from the lower pole of the RIGHT kidney is indeterminate. Recommend MRI with and without contrast for further evaluation. Electronically Signed   By: Genevive Bi M.D.   On: 01/12/2018 10:51   Dg Chest Port 1 View  Result Date: 01/22/2018 CLINICAL DATA:  Shortness of breath.  Current smoker.  Hypertension. EXAM: PORTABLE CHEST 1 VIEW COMPARISON:  01/21/2018. FINDINGS: LEFT lower lobe atelectasis and consolidation with LEFT pleural effusion is redemonstrated. Unchanged cardiomediastinal silhouette. Calcified tortuous aorta. Mild vascular congestion. IMPRESSION: No active disease. Electronically Signed   By: Elsie Stain M.D.   On: 01/22/2018 07:18   Dg Abd Portable 1v  Result Date: 01/26/2018 CLINICAL DATA:  NG tube placement EXAM: PORTABLE ABDOMEN - 1 VIEW COMPARISON:  01/18/2018 FINDINGS: Feeding tube has been placed. The tip is in the fundus of the stomach. No disproportionate dilatation of bowel. No obvious free intraperitoneal gas. IMPRESSION: Feeding tube tip is in the fundus of the stomach. Electronically Signed   By: Jolaine Click M.D.   On: 01/26/2018 11:43   [place] Ekg Site Rite  Result Date: 01/22/2018 If Site Rite image not attached, placement could not be confirmed  due to current cardiac rhythm.   Consults: Treatment Team:  Lamont DowdyKolluru, Sarath, MD   Subjective:    Overnight Issues: Patient has been unable to take in adequate calories.  Had significant oral and mouth pain yesterday.  Started on nasogastric tube feeding, nystatin and viscous lidocaine.  So far tolerating tube feeds.  Main issues remain ongoing need for pressors (phenylephrine), A. fib with RVR for which she is on amiodarone and ongoing cardiogenic shock picture.  Objective:  Vital signs for last 24 hours: Temp:  [97.3 F (36.3 C)-97.8 F (36.6 C)] 97.6 F (36.4 C) (01/06 1600) Pulse Rate:  [50-213] 112 (01/06 1818) Resp:  [13-32] 15 (01/06  1818) BP: (77-125)/(41-66) 86/58 (01/06 1800) SpO2:  [86 %-100 %] 87 % (01/06 1818) Weight:  [60.2 kg] 60.2 kg (01/06 0418)  Hemodynamic parameters for last 24 hours:    Intake/Output from previous day: 01/05 0701 - 01/06 0700 In: 1443.9 [I.V.:598.6; NG/GT:645.3; IV Piggyback:200] Out: 780 [Urine:780]  Intake/Output this shift: Total I/O In: 417.7 [I.V.:417.7] Out: 150 [Urine:150]  Vent settings for last 24 hours:    Physical Exam:  Vital signs:       Please see the above listed vital signs General: Frail, debilitated woman  awake, responsive and communicating, she looks uncomfortable.  Does not endorse pain at this moment HEENT: Trachea midline, no oral lesions appreciated, on nasal cannula oxygen.   Cardiovascular: Remains in A. fib with mild tachycardia and frequent ectopics.  No murmurs appreciated Pulmonary:  Bibasilar crackles, no wheezes. Abdominal:  Hypoactive bowel sounds, not distended, no significant point tenderness to palpation Extremities:   No clubbing, no edema noted, dusky toes.  Extremities are cool to touch capillary refill over 2 seconds. Neurologic:    Patient moves all extremities, no clear focal deficits noted.  She is very debilitated.  Assessment/Plan:  1. Shock:  Potential etiology is septic versus cardiogeniccolitis ischemic versus infectious could be a factor.  Lactic acid has normalized so favor infectious.  Additional sources of shock could be cardiogenic given her poor EF.  Will recheck echocardiogram.  Patient is on low-dose Neo-Synephrine.  Hemodynamics remain tenuous and she may need inotrope support though this may be difficult given her arrhythmia.  This issue is being compounded by pre-existing moderate pulmonary hypertension.  2.  Acute on chronic respiratory failure.  She waxes and wanes in this regard.  She has had to be placed back on high flow nasal cannula.  Does not tolerate BiPAP and does not want BiPAP.Marland Kitchen.  Bronchospasm resolved.   De-escalate steroids as not wheezing.  3. Acute renal failure: Could be ATN versus cardiorenal, nephrology consulting.  Appreciate input.      4.  Atrial fibrillation.  On amiodarone, has intermittent bouts of atrial fibrillation presently in atrial fibrillation with a ventricular response of low 100's  5. Abdominal pain.  KUB revealed nonobstructive gas pattern.  We will continue nasogastric tube feedings as tolerated  6. Leukocytosis: on meropenem, may be aggravated by steroids.  7.  Chronic systolic heart failure with reduced ejection fraction, this issue adds complexity to her management and may be perpetuating her shock physiology.  Reassessing with 2D echo.  8. Right renal mass.  If she survives this hospitalization this will need follow-up as an outpatient.   I have discussed with the patient's family at the bedside and particularly with her daughter that the patient's prognosis is very poor.  We will continue supportive care as is.  The patient is however DNR.  Multidisciplinary rounds were performed with the ICU team.  Total critical care time 45 minutes   C.Danice GoltzLaura Lanore Renderos, MD [facility]  01/28/2018

## 2018-01-28 NOTE — Progress Notes (Signed)
[facility], [place] 01/28/18  Subjective:   UOP 780 cc Multiple family members at bedside Patient remains critically ill although alert and able to answer some questions NG tube feeds continued No shortness of breath   Objective:  Vital signs in last 24 hours:  Temp:  [97.4 F (36.3 C)-97.8 F (36.6 C)] 97.4 F (36.3 C) (01/06 0000) Pulse Rate:  [95-112] 102 (01/06 0737) Resp:  [13-26] 19 (01/06 0737) BP: (77-125)/(41-66) 98/52 (01/06 0715) SpO2:  [86 %-100 %] 96 % (01/06 0737) Weight:  [60.2 kg] 60.2 kg (01/06 0418)  Weight change: -0.3 kg Filed Weights   01/26/18 0402 01/27/18 0500 01/28/18 0418  Weight: 59.6 kg 60.5 kg 60.2 kg    Intake/Output:    Intake/Output Summary (Last 24 hours) at 01/28/2018 1117 Last data filed at 01/28/2018 0700 Gross per 24 hour  Intake 1105.13 ml  Output 620 ml  Net 485.13 ml     Physical Exam: General:  Frail, elderly, chronically ill-appearing, laying in bed  HEENT  moist oral mucous membranes  Neck  supple  Pulm/lungs  right basilar, mid lung field crackles, left clear  CVS/Heart  tachycardic  Abdomen:   Soft, nontender  Extremities:  No peripheral edema  Neurologic:  Alert, able to answer questions, recognize family  Skin:  No acute rashes    Foley in place       Basic Metabolic Panel:  Recent Labs  Lab 01/22/18 0837  01/25/18 1000 01/26/18 0002 01/27/18 0503 01/27/18 2126 01/28/18 0406  NA 132*   < > 135 134* 136 137 139  K 5.1   < > 5.1 5.1 4.7 5.0 5.0  CL 108   < > 107 108 109 111 112*  CO2 18*   < > 17* 17* 17* 19* 18*  GLUCOSE 107*   < > 149* 151* 205* 180* 158*  BUN 70*   < > 96* 100* 104* 115* 113*  CREATININE 2.82*   < > 3.44* 3.45* 3.35* 3.29* 3.10*  CALCIUM 8.4*   < > 8.0* 7.8* 7.9* 8.1* 8.0*  MG 2.1  --   --   --   --  2.3  --   PHOS 6.4*  --  6.7* 6.5* 5.9* 6.1*  --    < > = values in this interval not displayed.     CBC: Recent Labs  Lab 01/22/18 0837  01/22/18 1052 01/23/18 0606 01/26/18 1332 01/27/18 2126 01/28/18 0406  WBC 15.4* 14.5* 15.6* 22.3* 19.3* 26.5*  NEUTROABS 12.9* 12.3*  --  20.9*  --   --   HGB 12.8 13.6 14.0 13.3 12.4 13.7  HCT 39.5 42.0 43.5 39.5 37.6 40.2  MCV 101.8* 101.7* 102.1* 96.8 98.9 97.1  PLT 130* 119* 118* 115* 98* PLATELET CLUMPS NOTED ON SMEAR, UNABLE TO ESTIMATE     No results found for: HEPBSAG, HEPBSAB, HEPBIGM    Microbiology:  Recent Results (from the past 240 hour(s))  MRSA PCR Screening     Status: None   Collection Time: 01/22/18 10:52 AM  Result Value Ref Range Status   MRSA by PCR NEGATIVE NEGATIVE Final    Comment:        The GeneXpert MRSA Assay (FDA approved for NASAL specimens only), is one component of a comprehensive MRSA colonization surveillance program. It is not intended to diagnose MRSA infection nor to guide or monitor treatment for MRSA infections. Performed at [facility], [address] [place], [place] [address]  Coagulation Studies: No results for input(s): LABPROT, INR in the last 72 hours.  Urinalysis: No results for input(s): COLORURINE, LABSPEC, PHURINE, GLUCOSEU, HGBUR, BILIRUBINUR, KETONESUR, PROTEINUR, UROBILINOGEN, NITRITE, LEUKOCYTESUR in the last 72 hours.  Invalid input(s): APPERANCEUR    Imaging: Dg Abd 1 View  Result Date: 01/27/2018 CLINICAL DATA:  Abdominal pain beginning today during tube feeding. EXAM: ABDOMEN - 1 VIEW COMPARISON:  01/26/2018 and CT 01/05/2018 FINDINGS: Enteric tube is present with tip over the distal stomach just left of midline. Right femoral catheter likely venous in origin with tip in the region of the external iliac vein over the level of the sacroiliac joint. Bowel gas pattern is nonobstructive. Remainder the exam is unchanged. IMPRESSION: Nonobstructive bowel gas pattern. Enteric tube with tip over the distal stomach just left of midline. Electronically Signed   By: Elberta Fortis M.D.   On: 01/27/2018  08:20     Medications:   . amiodarone 30 mg/hr (01/28/18 1028)  . dexmedetomidine (PRECEDEX) IV infusion Stopped (01/25/18 0552)  . feeding supplement (VITAL AF 1.2 CAL) 45 mL/hr at 01/27/18 1600  . meropenem (MERREM) IV 500 mg (01/28/18 1027)  . phenylephrine (NEO-SYNEPHRINE) Adult infusion 50 mcg/min (01/28/18 0839)   . budesonide (PULMICORT) nebulizer solution  0.5 mg Nebulization BID  . feeding supplement  1 Container Oral TID BM  . free water  30 mL Per Tube Q4H  . furosemide  40 mg Intravenous Daily  . insulin aspart  0-9 Units Subcutaneous Q4H  . ipratropium-albuterol  3 mL Nebulization TID  . magic mouthwash  5 mL Oral QID   And  . lidocaine  5 mL Mouth/Throat TID  . mouth rinse  15 mL Mouth Rinse BID  . methylPREDNISolone (SOLU-MEDROL) injection  60 mg Intravenous Q12H  . multivitamin  15 mL Per Tube Daily  . nutrition supplement (JUVEN)  1 packet Per Tube BID BM  . nystatin  5 mL Oral QID  . pantoprazole (PROTONIX) IV  40 mg Intravenous Q24H  . sennosides  5 mL Oral BID  . sodium chloride flush  10-40 mL Intracatheter Q12H  . vitamin C  250 mg Oral BID   acetaminophen **OR** acetaminophen, albuterol, bisacodyl, HYDROcodone-acetaminophen, morphine injection, ondansetron **OR** ondansetron (ZOFRAN) IV, phenol, sodium chloride flush, traZODone  Assessment/ Plan:  78 y.o.caucasian female with COPD, hypertension, hyperlipidemia, osteoarthritis, osteopenia,systolic congestive heart failure EF 30%who was admitted to [facility] on 12[facility]26/2019for colitis, pneumonia and acute renal failure  1.  AKI, likely ATN from underlying infection and iv contrast exposure 2.  chronic kidney disease stage III: baseline creatinine of 1.4, GFR of 35 on 01/19/2018. 3.  Ischemic versus infectious colitis 4.  Right renal mass, outpatient follow-up 5.  Moderate pulmonary hypertension by cardiac cath November 2019  Patient likely has acute infection admitted with IV contrast exposure.  Currently  she is nonoliguric.  Serum creatinine and BUN are critically elevated although slightly improved compared to yesterday.  We discussed with the family that if she gets any other renal insults leading to worsening of renal function, we may have to think about dialysis although her health and clinical condition are too fragile to undergo dialysis.  Family verbalized understanding.  We will continue to monitor.   LOS: 11 Christine Mcclain 1/6/202011:17 AM  [facility] [place], [place] [phone]  Note: This note was prepared with Dragon dictation. Any transcription errors are unintentional

## 2018-01-29 ENCOUNTER — Inpatient Hospital Stay (HOSPITAL_COMMUNITY)
Admit: 2018-01-29 | Discharge: 2018-01-29 | Disposition: A | Discharge status: Home / Self Care | Payer: Medicare Other | Attending: Pulmonary Disease | Admitting: Pulmonary Disease

## 2018-01-29 DIAGNOSIS — I200 Unstable angina: Secondary | ICD-10-CM

## 2018-01-29 DIAGNOSIS — Z515 Encounter for palliative care: Secondary | ICD-10-CM

## 2018-01-29 DIAGNOSIS — Z7189 Other specified counseling: Secondary | ICD-10-CM

## 2018-01-29 DIAGNOSIS — I5082 Biventricular heart failure: Secondary | ICD-10-CM

## 2018-01-29 LAB — CBC WITH DIFFERENTIAL/PLATELET
ABS IMMATURE GRANULOCYTES: 0.51 10*3/uL — AB (ref 0.00–0.07)
Basophils Absolute: 0.1 10*3/uL (ref 0.0–0.1)
Basophils Relative: 0 %
Eosinophils Absolute: 0 10*3/uL (ref 0.0–0.5)
Eosinophils Relative: 0 %
HCT: 40.3 % (ref 36.0–46.0)
HEMOGLOBIN: 13.5 g/dL (ref 12.0–15.0)
Immature Granulocytes: 2 %
LYMPHS ABS: 0.4 10*3/uL — AB (ref 0.7–4.0)
Lymphocytes Relative: 1 %
MCH: 32.6 pg (ref 26.0–34.0)
MCHC: 33.5 g/dL (ref 30.0–36.0)
MCV: 97.3 fL (ref 80.0–100.0)
Monocytes Absolute: 2.4 10*3/uL — ABNORMAL HIGH (ref 0.1–1.0)
Monocytes Relative: 8 %
NRBC: 0.1 % (ref 0.0–0.2)
Neutro Abs: 28.3 10*3/uL — ABNORMAL HIGH (ref 1.7–7.7)
Neutrophils Relative %: 89 %
Platelets: UNDETERMINED 10*3/uL (ref 150–400)
RBC: 4.14 MIL/uL (ref 3.87–5.11)
RDW: 16 % — ABNORMAL HIGH (ref 11.5–15.5)
WBC: 31.6 10*3/uL — ABNORMAL HIGH (ref 4.0–10.5)

## 2018-01-29 LAB — ECHOCARDIOGRAM COMPLETE
Height: 62 in
Weight: 2225.76 oz

## 2018-01-29 LAB — COMPREHENSIVE METABOLIC PANEL
ALT: 42 U/L (ref 0–44)
AST: 94 U/L — AB (ref 15–41)
Albumin: 2 g/dL — ABNORMAL LOW (ref 3.5–5.0)
Alkaline Phosphatase: 55 U/L (ref 38–126)
Anion gap: 7 (ref 5–15)
BUN: 137 mg/dL — AB (ref 8–23)
CO2: 20 mmol/L — ABNORMAL LOW (ref 22–32)
Calcium: 8.3 mg/dL — ABNORMAL LOW (ref 8.9–10.3)
Chloride: 110 mmol/L (ref 98–111)
Creatinine, Ser: 3.18 mg/dL — ABNORMAL HIGH (ref 0.44–1.00)
GFR calc Af Amer: 15 mL/min — ABNORMAL LOW (ref >60)
GFR calc non Af Amer: 13 mL/min — ABNORMAL LOW (ref >60)
GLUCOSE: 159 mg/dL — AB (ref 70–99)
Potassium: 5.6 mmol/L — ABNORMAL HIGH (ref 3.5–5.1)
Sodium: 137 mmol/L (ref 135–145)
Total Bilirubin: 0.6 mg/dL (ref 0.3–1.2)
Total Protein: 4.6 g/dL — ABNORMAL LOW (ref 6.5–8.1)

## 2018-01-29 LAB — GLUCOSE, CAPILLARY
GLUCOSE-CAPILLARY: 155 mg/dL — AB (ref 70–99)
Glucose-Capillary: 151 mg/dL — ABNORMAL HIGH (ref 70–99)
Glucose-Capillary: 159 mg/dL — ABNORMAL HIGH (ref 70–99)
Glucose-Capillary: 166 mg/dL — ABNORMAL HIGH (ref 70–99)

## 2018-01-29 MED ORDER — GLYCOPYRROLATE 0.2 MG/ML IJ SOLN: 0.2000 mg | INTRAMUSCULAR | Status: DC | PRN

## 2018-01-29 MED ORDER — INSULIN ASPART 100 UNIT/ML IV SOLN
10.0000 [IU] | Freq: Once | INTRAVENOUS | Status: AC
  Administered 2018-01-29: 10 [IU] via INTRAVENOUS
  Filled 2018-01-29 (×2): qty 0.1

## 2018-01-29 MED ORDER — BIOTENE DRY MOUTH MT LIQD: 15.0000 mL | OROMUCOSAL | Status: DC | PRN

## 2018-01-29 MED ORDER — HYDROMORPHONE HCL 1 MG/ML IJ SOLN
1.0000 mg | INTRAMUSCULAR | Status: DC | PRN
  Administered 2018-01-29: 1 mg via INTRAVENOUS
  Filled 2018-01-29: qty 1

## 2018-01-29 MED ORDER — ONDANSETRON HCL 4 MG/2ML IJ SOLN: 4.0000 mg | Freq: Four times a day (QID) | INTRAMUSCULAR | Status: DC | PRN

## 2018-01-29 MED ORDER — LORAZEPAM 2 MG/ML IJ SOLN
0.5000 mg | INTRAMUSCULAR | Status: DC | PRN
  Administered 2018-01-29: 1 mg via INTRAVENOUS
  Filled 2018-01-29: qty 1

## 2018-01-29 MED ORDER — MORPHINE SULFATE (PF) 2 MG/ML IV SOLN
1.0000 mg | INTRAVENOUS | Status: DC | PRN
  Administered 2018-01-29: 1 mg via INTRAVENOUS
  Filled 2018-01-29: qty 1

## 2018-01-29 MED ORDER — DEXTROSE 50 % IV SOLN
1.0000 | Freq: Once | INTRAVENOUS | Status: AC
  Administered 2018-01-29: 50 mL via INTRAVENOUS
  Filled 2018-01-29: qty 50

## 2018-01-29 MED ORDER — POLYVINYL ALCOHOL 1.4 % OP SOLN
1.0000 [drp] | Freq: Four times a day (QID) | OPHTHALMIC | Status: DC | PRN
  Filled 2018-01-29: qty 15

## 2018-01-29 MED ORDER — HYDROCODONE-ACETAMINOPHEN 5-325 MG PO TABS: 1.0000 | ORAL_TABLET | ORAL | Status: DC | PRN

## 2018-01-29 NOTE — Progress Notes (Signed)
Follow up - Critical Care Medicine Note  Patient Details:    Christine Mcclain is an 78 y.o. female.with a past medical history remarkable for hypertension, hyperlipidemia, COPD, asthma, arthritis, was admitted on 12/26 after developing left lower quadrant pain associated with nausea.  She also was passing black stool.  Was admitted and treated for colitis, fluid resuscitation, antibiotics with Cipro and Flagyl.  She was also noted during hospitalization to have a right lower lobe pneumonia, possible melena, acute renal injury, elevated troponin, chronic systolic heart failure with an ejection fraction of 30%, right renal mass being followed up as outpatient.  Rapid response secondary to decreasing blood pressure and loss of IV access. Patient is also having some increasing shortness of breath and was moved to the intensive care unit for further therapy.   Lines, Airways, Drains: CVC Triple Lumen 01/22/18 Right Femoral 20 cm (Active)  Indication for Insertion or Continuance of Line Vasoactive infusions 01/22/2018  8:10 PM  Site Assessment Clean;Dry;Intact 01/22/2018  8:10 PM  Proximal Lumen Status Flushed;Saline locked 01/22/2018  8:10 PM  Medial Lumen Status Infusing;Flushed 01/22/2018  8:10 PM  Distal Lumen Status Flushed;Saline locked 01/22/2018  8:10 PM  Dressing Type Transparent;Occlusive 01/22/2018  8:10 PM  Dressing Status Clean;Dry;Intact;Antimicrobial disc in place 01/22/2018  8:10 PM  Line Care Connections checked and tightened 01/22/2018  8:10 PM  Dressing Intervention New dressing 01/22/2018 11:00 AM  Dressing Change Due 01/29/18 01/22/2018  8:10 PM     Urethral Catheter C Green, RN Double-lumen;Latex 14 Fr. (Active)  Indication for Insertion or Continuance of Catheter Unstable critical patients (first 24-48 hours) 01/23/2018  5:17 AM  Site Assessment Clean;Intact 01/23/2018  5:17 AM  Date Prophylactic Dressing Applied (if applicable) 01/22/18 01/22/2018 11:00 AM  Catheter Maintenance Bag  below level of bladder 01/23/2018  5:17 AM  Collection Container Standard drainage bag 01/23/2018  5:17 AM  Securement Method Securing device (Describe) 01/23/2018  5:17 AM  Output (mL) 315 mL 01/22/2018  4:00 PM    Anti-infectives:  Anti-infectives (From admission, onward)   Start     Dose/Rate Route Frequency Ordered Stop   01/22/18 1130  meropenem (MERREM) 500 mg in sodium chloride 0.9 % 100 mL IVPB  Status:  Discontinued     500 mg 200 mL/hr over 30 Minutes Intravenous Every 12 hours 01/22/18 1129 01/29/18 1231   01/19/18 1700  ciprofloxacin (CIPRO) tablet 500 mg  Status:  Discontinued     500 mg Oral Daily with supper 01/19/18 1331 01/22/18 1058   01/19/18 0600  metroNIDAZOLE (FLAGYL) tablet 500 mg  Status:  Discontinued     500 mg Oral Every 8 hours 01/18/18 1442 01/22/18 1058   01/18/18 2000  ciprofloxacin (CIPRO) tablet 250 mg  Status:  Discontinued     250 mg Oral 2 times daily 01/18/18 1442 01/18/18 1628   01/18/18 1800  ciprofloxacin (CIPRO) IVPB 400 mg  Status:  Discontinued     400 mg 200 mL/hr over 60 Minutes Intravenous Every 24 hours 01/18/18 1628 01/19/18 1331   01/18/18 0000  levofloxacin (LEVAQUIN) IVPB 750 mg  Status:  Discontinued     750 mg 100 mL/hr over 90 Minutes Intravenous  Once 01/06/2018 1159 01/09/2018 1159   01/22/2018 1200  ciprofloxacin (CIPRO) IVPB 400 mg  Status:  Discontinued     400 mg 200 mL/hr over 60 Minutes Intravenous Every 12 hours 01/08/2018 1158 01/08/2018 1159   01/11/2018 1200  metroNIDAZOLE (FLAGYL) IVPB 500 mg  Status:  Discontinued  500 mg 100 mL/hr over 60 Minutes Intravenous Every 8 hours 01/10/2018 1158 12/26/2017 1159   12/24/2017 1200  metroNIDAZOLE (FLAGYL) IVPB 500 mg     500 mg 100 mL/hr over 60 Minutes Intravenous Every 8 hours 01/16/2018 1159 01/18/18 2318   12/23/2017 1130  levofloxacin (LEVAQUIN) IVPB 750 mg     750 mg 100 mL/hr over 90 Minutes Intravenous  Once 01/21/2018 1116 12/29/2017 1336      Microbiology: Results for orders placed or  performed during the hospital encounter of 12/25/2017  Respiratory Panel by PCR     Status: None   Collection Time: 01/18/18 10:28 AM  Result Value Ref Range Status   Adenovirus NOT DETECTED NOT DETECTED Final   Coronavirus 229E NOT DETECTED NOT DETECTED Final   Coronavirus HKU1 NOT DETECTED NOT DETECTED Final   Coronavirus NL63 NOT DETECTED NOT DETECTED Final   Coronavirus OC43 NOT DETECTED NOT DETECTED Final   Metapneumovirus NOT DETECTED NOT DETECTED Final   Rhinovirus / Enterovirus NOT DETECTED NOT DETECTED Final   Influenza A NOT DETECTED NOT DETECTED Final   Influenza B NOT DETECTED NOT DETECTED Final   Parainfluenza Virus 1 NOT DETECTED NOT DETECTED Final   Parainfluenza Virus 2 NOT DETECTED NOT DETECTED Final   Parainfluenza Virus 3 NOT DETECTED NOT DETECTED Final   Parainfluenza Virus 4 NOT DETECTED NOT DETECTED Final   Respiratory Syncytial Virus NOT DETECTED NOT DETECTED Final   Bordetella pertussis NOT DETECTED NOT DETECTED Final   Chlamydophila pneumoniae NOT DETECTED NOT DETECTED Final   Mycoplasma pneumoniae NOT DETECTED NOT DETECTED Final    Comment: Performed at [facility], [address], [place], [place] [address]  MRSA PCR Screening     Status: None   Collection Time: 01/22/18 10:52 AM  Result Value Ref Range Status   MRSA by PCR NEGATIVE NEGATIVE Final    Comment:        The GeneXpert MRSA Assay (FDA approved for NASAL specimens only), is one component of a comprehensive MRSA colonization surveillance program. It is not intended to diagnose MRSA infection nor to guide or monitor treatment for MRSA infections. Performed at [facility], [address], [place], [place] [address]    Studies: Dg Chest 2 View  Result Date: 01/21/2018 CLINICAL DATA:  Hypoxia. EXAM: CHEST - 2 VIEW COMPARISON:  Chest x-ray 8 01/19/2018.  CT 01/16/2018, 09/05/2017. FINDINGS: Mediastinum and hilar structures are normal. Heart size stable. Left lower lobe  atelectasis/consolidation with left-sided pleural effusion. Small right pleural effusion. Elevation left hemidiaphragm again noted. No pneumothorax contrast noted over the colon. Degenerative change and osteopenia thoracic spine. IMPRESSION: 1. Left lower lobe atelectasis/consolidation with left-sided pleural effusion. Small right pleural effusion. These findings are new from prior exam. 2.  Elevation left hemidiaphragm again noted. Electronically Signed   By: Maisie Fus  Register   On: 01/21/2018 13:01   Dg Chest 2 View  Result Date: 12/23/2017 CLINICAL DATA:  Left lower quadrant pain. EXAM: CHEST - 2 VIEW COMPARISON:  Eight/14/19 FINDINGS: Normal heart size. Aortic atherosclerosis. No pleural effusion or edema. No airspace opacities. Asymmetric elevation of left hemidiaphragm is again noted. No acute bone abnormalities noted. Spondylosis identified within the thoracic spine. IMPRESSION: No active cardiopulmonary disease. Aortic Atherosclerosis (ICD10-I70.0). Electronically Signed   By: Signa Kell M.D.   On: 01/21/2018 09:14   Dg Abd 1 View  Result Date: 01/27/2018 CLINICAL DATA:  Abdominal pain beginning today during tube feeding. EXAM: ABDOMEN - 1 VIEW COMPARISON:  01/26/2018 and CT 01/16/2018 FINDINGS: Enteric tube is present with tip over the distal stomach just left of midline. Right femoral catheter likely venous in origin with tip in the region of the external iliac vein over the level of the sacroiliac joint. Bowel gas pattern is nonobstructive. Remainder the exam is unchanged. IMPRESSION: Nonobstructive bowel gas pattern. Enteric tube with tip over the distal stomach just left of midline. Electronically Signed   By: Elberta Fortisaniel  Boyle M.D.   On: 01/27/2018 08:20   Ct Abdomen Pelvis W Contrast  Result Date: 01/13/2018 CLINICAL DATA:  LEFT lower quadrant pain. Nausea. PET-CT 02/16/2016 EXAM: CT ABDOMEN AND PELVIS WITH CONTRAST TECHNIQUE: Multidetector CT imaging of the abdomen and pelvis was  performed using the standard protocol following bolus administration of intravenous contrast. CONTRAST:  60mL OMNIPAQUE IOHEXOL 300 MG/ML  SOLN COMPARISON:  PET-CT 02/16/2016, CT chest 09/05/2017 FINDINGS: Lower chest: Mild nodular airspace disease in the RIGHT lower lobe is new from comparison CT 09/05/2017 Hepatobiliary: No focal hepatic lesion. Mild periportal edema. Postcholecystectomy. Pancreas: Pancreas is normal. No ductal dilatation. No pancreatic inflammation. Spleen: Normal spleen Adrenals/urinary tract: Adrenal glands normal. Benign cyst of the LEFT kidney. No renal obstruction. Exophytic from the lower pole of the RIGHT kidney there is a ovoid high-density mass measuring 2.7 cm with HU equal 70. High-density lesion on comparison noncontrast PET-CT scan of 02/16/2016 at equal density; however, lesion was smaller measuring 2.2 by 1.8 cm. Lesion had no significant metabolic activity at that time. No renal obstruction. There is moderate volume of gas within the bladder collecting non dependently (image 62/6). Stomach/Bowel: Stomach, small-bowel normal. Post appendectomy. There is mild thickening of the ascending colon. Small amount fluid surrounding the ascending colon. Transverse descending colon appear normal. Rectosigmoid colon normal. Vascular/Lymphatic: Abdominal aorta is normal caliber with atherosclerotic calcification. There is no retroperitoneal or periportal lymphadenopathy. No pelvic lymphadenopathy. Reproductive: Post hysterectomy Other: Moderate amount of simple fluid the posterior cul-de-sac. Musculoskeletal: no aggressive osseous lesion. Severe degenerate changes of the lower lumbar spine. IMPRESSION: 1. New nodular airspace disease in the RIGHT lower lobe could represent aspiration pneumonitis or pneumonia. 2. Mild thickening of the ascending colon suggests segmental colitis. Differential would include inflammatory bowel disease, infectious colitis, drug induced colitis, and ischemic colitis.  3. Moderate volume of gas within the bladder. Recommend correlation with instrumentation. 4. High-density mass exophytic from the lower pole of the RIGHT kidney is indeterminate. Recommend MRI with and without contrast for further evaluation. Electronically Signed   By: Genevive BiStewart  Edmunds M.D.   On: 01/16/2018 10:51   Dg Chest Port 1 View  Result Date: 01/22/2018 CLINICAL DATA:  Shortness of breath.  Current smoker.  Hypertension. EXAM: PORTABLE CHEST 1 VIEW COMPARISON:  01/21/2018. FINDINGS: LEFT lower lobe atelectasis and consolidation with LEFT pleural effusion is redemonstrated. Unchanged cardiomediastinal silhouette. Calcified tortuous aorta. Mild vascular congestion. IMPRESSION: No active disease. Electronically Signed   By: Elsie StainJohn T Curnes M.D.   On: 01/22/2018 07:18   Dg Abd Portable 1v  Result Date: 01/26/2018 CLINICAL DATA:  NG tube placement EXAM: PORTABLE ABDOMEN - 1 VIEW COMPARISON:  12/28/2017 FINDINGS: Feeding tube has been placed. The tip is in the fundus of the stomach. No disproportionate dilatation of bowel. No obvious free intraperitoneal gas. IMPRESSION: Feeding tube tip is in the fundus of the stomach. Electronically Signed   By: Jolaine ClickArthur  Hoss M.D.   On: 01/26/2018 11:43   [place] Ekg Site Rite  Result Date: 01/22/2018 If Site Rite image not attached, placement could  not be confirmed due to current cardiac rhythm.   Consults: Treatment Team:  Lamont Dowdy, MD   Subjective:    Overnight Issues: Patient continues to decline with decreasing mentation.  Increasing O2 requirements.  She is DNR.  Declines use of BiPAP.  Heart rate remains in the low 100s, atrial fib despite amiodarone.  Continued need of pressors.  She is now anuric.  Had 2D echo this morning, results pending.  The patient appears terminal.  I have discussed this with the family at bedside.  Objective:  Vital signs for last 24 hours: Temp:  [97.6 F (36.4 C)-99.2 F (37.3 C)] 97.9 F (36.6 C) (01/07 0800) Pulse  Rate:  [101-213] 107 (01/07 1000) Resp:  [12-44] 28 (01/07 1000) BP: (80-120)/(46-69) 86/48 (01/07 1000) SpO2:  [86 %-95 %] 94 % (01/07 1000) FiO2 (%):  [33 %] 33 % (01/07 0722) Weight:  [63.1 kg] 63.1 kg (01/07 0435)  Hemodynamic parameters for last 24 hours:    Intake/Output from previous day: 01/06 0701 - 01/07 0700 In: 2272.9 [I.V.:922.9; NG/GT:1350] Out: 250 [Urine:250]  Intake/Output this shift: Total I/O In: 158.4 [I.V.:158.4] Out: -   Vent settings for last 24 hours: FiO2 (%):  [33 %] 33 %  Physical Exam:  Vital signs:       Please see the above listed vital signs General: Frail, debilitated woman lethargic, poorly responsive, she looks uncomfortable.  When awakened she does not endorse pain but endorses dyspnea.  She is on high flow nasal cannula. HEENT: Trachea midline, no oral lesions appreciated, edentulous.   Cardiovascular: Remains in A. fib with mild tachycardia and frequent ectopics.  No murmurs appreciated Pulmonary:  Bibasilar crackles, no wheezes. Abdominal:  Hypoactive bowel sounds, not distended, no significant point tenderness to palpation.  Has been tolerating trickle feeds. Extremities:   No clubbing, no edema noted, dusky toes.  Extremities are cool to touch capillary refill over 3 seconds. Neurologic:    Patient moves all extremities, no clear focal deficits noted.  She is very debilitated.  Assessment/Plan:  1. Shock:  Potential etiology is septic versus cardiogenic, colitis ischemic versus infectious could be a factor.  Lactic acid has normalized so favor infectious.  Additional sources of shock could be cardiogenic given her poor EF (previously 30%).  Recheck echocardiogram pending.  Patient is on levophed. This issue is being compounded by pre-existing moderate pulmonary hypertension.  2.  Acute on chronic respiratory failure.  She waxes and wanes in this regard.  She has had to be placed back on high flow nasal cannula.  Does not tolerate BiPAP and  does not want BiPAP.  Bronchospasm resolved.  De-escalated steroids as not wheezing.  3. Acute renal failure: Could be ATN versus cardiorenal, have discussed with Dr. Thedore Mins of nephrology.  Patient is not a candidate for CRRT nor dialysis due to poor hemodynamics and terminal status.   Dr. Thedore Mins agrees with transitioning to comfort.  4.  Atrial fibrillation.  On amiodarone, has intermittent bouts of atrial fibrillation presently in atrial fibrillation with a ventricular response of low 100's  5. Abdominal pain.  KUB revealed nonobstructive gas pattern.  We will continue nasogastric tube feedings as tolerated, discontinue once transition to comfort care.  6. Leukocytosis: Likely related to demargination from steroids.  She has been afebrile.  Discontinued meropenem.  7.  Chronic systolic heart failure with reduced ejection fraction, this issue adds complexity to her management and may be perpetuating her shock physiology.  Reassessing with 2D echo.  8.  Right renal mass.  If she survives this hospitalization this will need follow-up as an outpatient.   I have discussed with the patient's family at the bedside and particularly with her daughter that the patient's prognosis is very poor.  I have indicated to them that I believe that it is time to transition to comfort care.  We will get palliative care involved.  The patient is DNR.  Multidisciplinary rounds were performed with the ICU team.  Total critical care time 45 minutes   C.Danice Goltz, MD [facility]  01/29/2018

## 2018-01-29 NOTE — Progress Notes (Signed)
*  PRELIMINARY RESULTS* Echocardiogram 2D Echocardiogram has been performed.  Christine Mcclain Christine Mcclain 01/29/2018, 10:41 AM

## 2018-01-29 NOTE — Progress Notes (Signed)
Palliative:  Full note to follow. I have spoken with 4 children and grandchildren at bedside (and separately to husband via telephone). All agree that Ms. Krane would not want to live this way. We discussed plan for comfort care with liberalized medication for comfort, d/c meds/infusions not adding to comfort, d/c NGT, and do not escalate care. Will keep HFNC at this time but will not increase. We did discuss titrating down oxygen as a measure of comfort as well. I believe prognosis is poor and could live hours to days even with HFNC.   Yong Channel, NP Palliative Medicine Team Pager # [phone] (M-F 8a-5p) Team Phone # [phone] (Nights/Weekends)

## 2018-01-29 NOTE — Progress Notes (Signed)
Patient is alert and able to follow commands. Patient was transitioned from [place] to HFNC at the beginning of the shift, seems to be tolerating well. Patient still remains on Levophed and Amio. Urine output is very minimal. Patient's husband expressed feelings of wanting to make patient comfortable and "letting her go". Informed husband that there were services available to provide this kind of care. Will continue to monitor and address any concerns.

## 2018-01-29 NOTE — Progress Notes (Signed)
[employer] [facility] - [place] at [facility]   PATIENT NAME: Christine Mcclain    MR#:  [id number]  DATE OF BIRTH:  02-Feb-1940  SUBJECTIVE:  Patient with decline in mentation Not eating Overall doing poorly On pressors and anuric  REVIEW OF SYSTEMS:   declined mentation   Tolerating Diet: NGT      DRUG ALLERGIES:   Allergies  Allergen Reactions  . Gabapentin Hives  . Naproxen Hives  . Penicillins Hives    Has patient had a PCN reaction causing immediate rash, facial/tongue/throat swelling, SOB or lightheadedness with hypotension: No Has patient had a PCN reaction causing severe rash involving mucus membranes or skin necrosis: No Has patient had a PCN reaction that required hospitalization: No Has patient had a PCN reaction occurring within the last 10 years: No If all of the above answers are "NO", then may proceed with Cephalosporin use.  . Codeine Rash    VITALS:  Blood pressure (!) 86/48, pulse (!) 107, temperature 97.9 F (36.6 C), temperature source Axillary, resp. rate (!) 28, height 5\' 2"  (1.575 m), weight 63.1 kg, SpO2 94 %.  PHYSICAL EXAMINATION:  Constitutional: Appears very  frail.  Critically ill-appearing HENT: Normocephalic. . NG tube placed Eyes: Conjunctivae are normal. no scleral icterus.  Neck: Normal ROM. Neck supple. No JVD. No tracheal deviation. CVS: irr, irr tachy no murmurs, no gallops, no carotid bruit.  Pulmonary: Normal respiratory effort with bilateral rails.  Abdominal: Positive bowel sounds no distension, no tenderness,no rebound or guarding.  Musculoskeletal:. No edema and no tenderness.  Neuro: Patient lethargic Skin: Skin is warm and dry. No rash noted.      LABORATORY PANEL:   CBC Recent Labs  Lab 01/29/18 0427  WBC 31.6*  HGB 13.5  HCT 40.3  PLT PLATELET CLUMPS NOTED ON SMEAR, UNABLE TO ESTIMATE    ------------------------------------------------------------------------------------------------------------------  Chemistries  Recent Labs  Lab 01/27/18 2126  01/29/18 0427  NA 137   < > 137  K 5.0   < > 5.6*  CL 111   < > 110  CO2 19*   < > 20*  GLUCOSE 180*   < > 159*  BUN 115*   < > 137*  CREATININE 3.29*   < > 3.18*  CALCIUM 8.1*   < > 8.3*  MG 2.3  --   --   AST 115*  --  94*  ALT 47*  --  42  ALKPHOS 52  --  55  BILITOT 0.5  --  0.6   < > = values in this interval not displayed.   ------------------------------------------------------------------------------------------------------------------  Cardiac Enzymes No results for input(s): TROPONINI in the last 168 hours. ------------------------------------------------------------------------------------------------------------------  RADIOLOGY:  No results found.   ASSESSMENT AND PLAN:   78 year old female with history of COPD who presented to the emergency room with abdominal pain and nausea.  1.  Septic shock: Etiology is ischemic versus infectious colitis Patient is pressors.   Appreciate intensivist consult  She has been taken off of meropenem for pneumonia and colitis  2.  Acute on chronic kidney disease: Acute kidney injury in the setting of IV contrast, prerenal azotemia and septic shock Nephrology consultation appreciated Continue management as per nephrology Patient now anuric.  As per nephrology she is not a candidate for CRRT or dialysis due to poor hemodynamics and terminal status. She also has renal mass (right side)  3.  Acute hypoxic respiratory failure in the setting of acute on chronic systolic heart failure with ejection  fraction 25 to 30% as well as mild COPD exacerbation and pneumonia Steroids have been discontinued Continue nasal cannula Bronchospasm has resolved Follow-up on echocardiogram 4.  Elevated troponin due to demand ischemia: Patient ruled out for ACS 5.  New onset atrial  fibrillation in the setting of above issues: Continue amiodarone  6.  Protein calorie malnutrition: Continue feeding supplement, G-tube 7.  Mouth sores: Encourage Magic mouthwash, nystatin and lidocaine  Patient with overall very poor prognosis.  Palliative care consultation pending.  Patient has shown significant decline since admission and would most benefit from hospice services.   D/w nursing  CODE STATUS: DNR TOTAL TIME TAKING CARE OF THIS PATIENT: 21 minutes.     POSSIBLE D/C ??, DEPENDING ON CLINICAL CONDITION.   Christine Mcclain M.D on 01/29/2018 at 1:05 PM  Between 7am to 6pm - Pager - [phone] After 6pm go to [email or web address] - password [email or web address]  [facility]  Office  [phone]  CC: Primary care physician; Christine Aldo, MD  Note: This dictation was prepared with Dragon dictation along with smaller phrase technology. Any transcriptional errors that result from this process are unintentional.

## 2018-01-29 NOTE — Progress Notes (Signed)
Pts family decided to make pt comfort care. All drips stopped and cortrak d/c'd. Pt given PRN Dilaudid and Ativan. Family at bedside and refusing chaplain to be called at this time.

## 2018-01-31 ENCOUNTER — Telehealth: Payer: Self-pay | Admitting: Pulmonary Disease

## 2018-01-31 NOTE — Telephone Encounter (Signed)
Death certificate complete, ready for pickup. [employer] aware.

## 2018-01-31 NOTE — Telephone Encounter (Signed)
Death certificate received, place in MD box for completion.

## 2018-02-23 NOTE — Death Summary Note (Signed)
DEATH SUMMARY   Patient Details  Name: Christine Mcclain Scruton MRN: [id number] DOB: 02/19/1940  Admission/Discharge Information   Admit Date:  01/16/2018  Date of Death: Date of Death: 02/09/2018  Time of Death: Time of Death: 0449  Length of Stay: 13  Referring Physician: Hillery AldoPatel, Sarah, MD   Reason(s) for Hospitalization  Septic Shock secondary to Colitis (Infectious vs. Ischemic) Acute Respiratory Failure secondary to Pneumonia Acute Kidney Injury Acute on Chronic Systolic Congestive Heart Failure  Atrial Fibrillation with RVR  Diagnoses  Preliminary cause of death:  Secondary Diagnoses (including complications and co-morbidities):  Active Problems:   Acute colitis   Malnutrition of moderate degree Septic Shock secondary Colitis (Infectious vs Ischemic) Acute Respiratory Failure secondary to Pneumonia Acute Kidney Injury Acute on Chronic Systolic Congestive Heart Failure Atrial Fibrillation with RVR  Brief Hospital Course (including significant findings, care, treatment, and services provided and events leading to death)  Christine Mcclain Schunk is a 78 y.o. year old female with a past medical history remarkable for hypertension, hyperlipidemia, COPD, asthma, arthritis, who was admitted on 01/12/2018 after developing left lower quadrant pain associated with nausea. She also was passing black stool. She admitted and treated for colitis (infectious vs. Ischemic) with IV fluid resuscitation and antibiotics (Cipro and Flagyl).She was also noted during hospitalization to have a right lower lobe pneumonia, possible melena, acute renal injury, elevated troponin, chronic systolic heart failure with an ejection fraction of 30%, right renal mass being followed up as outpatient. On 01/22/18, Rapid response was called requiring transfer to ICU due to decreasing blood pressure requiring vasopressors, loss of IV access requiring central line placement, and acute respiratory distress requiring  BiPAP.  Pt was a DNI,  and her/family declined intubation. On 01/25/18 pt with some improvement, was able to be weaned off BiPAP, but still required vasopressors.  On 01/29/18, pt declining with decreased mentation, increasing FiO2 requirements, continued need for vasopressors (septic shock +/- Cardiogenic shock), and anuria.  Pt and family declined BiPAP.  Palliative care was consulted, and family decided to make pt comfort care on 01/29/18.  Pt eventually expired early in the morning of 01/26/2018.    Pertinent Labs and Studies  Significant Diagnostic Studies Dg Chest 2 View  Result Date: 01/21/2018 CLINICAL DATA:  Hypoxia. EXAM: CHEST - 2 VIEW COMPARISON:  Chest x-ray 8 01/01/2018.  CT 01/20/2018, 09/05/2017. FINDINGS: Mediastinum and hilar structures are normal. Heart size stable. Left lower lobe atelectasis/consolidation with left-sided pleural effusion. Small right pleural effusion. Elevation left hemidiaphragm again noted. No pneumothorax contrast noted over the colon. Degenerative change and osteopenia thoracic spine. IMPRESSION: 1. Left lower lobe atelectasis/consolidation with left-sided pleural effusion. Small right pleural effusion. These findings are new from prior exam. 2.  Elevation left hemidiaphragm again noted. Electronically Signed   By: Maisie Fushomas  Register   On: 01/21/2018 13:01   Dg Chest 2 View  Result Date: 01/12/2018 CLINICAL DATA:  Left lower quadrant pain. EXAM: CHEST - 2 VIEW COMPARISON:  Eight/14/19 FINDINGS: Normal heart size. Aortic atherosclerosis. No pleural effusion or edema. No airspace opacities. Asymmetric elevation of left hemidiaphragm is again noted. No acute bone abnormalities noted. Spondylosis identified within the thoracic spine. IMPRESSION: No active cardiopulmonary disease. Aortic Atherosclerosis (ICD10-I70.0). Electronically Signed   By: Signa Kellaylor  Stroud M.D.   On: 01/20/2018 09:14   Dg Abd 1 View  Result Date: 01/27/2018 CLINICAL DATA:  Abdominal pain beginning today during tube feeding.  EXAM: ABDOMEN - 1 VIEW COMPARISON:  01/26/2018 and CT 12/30/2017 FINDINGS: Enteric  tube is present with tip over the distal stomach just left of midline. Right femoral catheter likely venous in origin with tip in the region of the external iliac vein over the level of the sacroiliac joint. Bowel gas pattern is nonobstructive. Remainder the exam is unchanged. IMPRESSION: Nonobstructive bowel gas pattern. Enteric tube with tip over the distal stomach just left of midline. Electronically Signed   By: Elberta Fortisaniel  Boyle M.D.   On: 01/27/2018 08:20   Ct Abdomen Pelvis W Contrast  Result Date: 01/10/2018 CLINICAL DATA:  LEFT lower quadrant pain. Nausea. PET-CT 02/16/2016 EXAM: CT ABDOMEN AND PELVIS WITH CONTRAST TECHNIQUE: Multidetector CT imaging of the abdomen and pelvis was performed using the standard protocol following bolus administration of intravenous contrast. CONTRAST:  60mL OMNIPAQUE IOHEXOL 300 MG/ML  SOLN COMPARISON:  PET-CT 02/16/2016, CT chest 09/05/2017 FINDINGS: Lower chest: Mild nodular airspace disease in the RIGHT lower lobe is new from comparison CT 09/05/2017 Hepatobiliary: No focal hepatic lesion. Mild periportal edema. Postcholecystectomy. Pancreas: Pancreas is normal. No ductal dilatation. No pancreatic inflammation. Spleen: Normal spleen Adrenals/urinary tract: Adrenal glands normal. Benign cyst of the LEFT kidney. No renal obstruction. Exophytic from the lower pole of the RIGHT kidney there is a ovoid high-density mass measuring 2.7 cm with HU equal 70. High-density lesion on comparison noncontrast PET-CT scan of 02/16/2016 at equal density; however, lesion was smaller measuring 2.2 by 1.8 cm. Lesion had no significant metabolic activity at that time. No renal obstruction. There is moderate volume of gas within the bladder collecting non dependently (image 62/6). Stomach/Bowel: Stomach, small-bowel normal. Post appendectomy. There is mild thickening of the ascending colon. Small amount fluid  surrounding the ascending colon. Transverse descending colon appear normal. Rectosigmoid colon normal. Vascular/Lymphatic: Abdominal aorta is normal caliber with atherosclerotic calcification. There is no retroperitoneal or periportal lymphadenopathy. No pelvic lymphadenopathy. Reproductive: Post hysterectomy Other: Moderate amount of simple fluid the posterior cul-de-sac. Musculoskeletal: no aggressive osseous lesion. Severe degenerate changes of the lower lumbar spine. IMPRESSION: 1. New nodular airspace disease in the RIGHT lower lobe could represent aspiration pneumonitis or pneumonia. 2. Mild thickening of the ascending colon suggests segmental colitis. Differential would include inflammatory bowel disease, infectious colitis, drug induced colitis, and ischemic colitis. 3. Moderate volume of gas within the bladder. Recommend correlation with instrumentation. 4. High-density mass exophytic from the lower pole of the RIGHT kidney is indeterminate. Recommend MRI with and without contrast for further evaluation. Electronically Signed   By: Genevive BiStewart  Edmunds M.D.   On: 01/08/2018 10:51   Dg Chest Port 1 View  Result Date: 01/22/2018 CLINICAL DATA:  Shortness of breath.  Current smoker.  Hypertension. EXAM: PORTABLE CHEST 1 VIEW COMPARISON:  01/21/2018. FINDINGS: LEFT lower lobe atelectasis and consolidation with LEFT pleural effusion is redemonstrated. Unchanged cardiomediastinal silhouette. Calcified tortuous aorta. Mild vascular congestion. IMPRESSION: No active disease. Electronically Signed   By: Elsie StainJohn Mcclain Curnes M.D.   On: 01/22/2018 07:18   Dg Abd Portable 1v  Result Date: 01/26/2018 CLINICAL DATA:  NG tube placement EXAM: PORTABLE ABDOMEN - 1 VIEW COMPARISON:  01/09/2018 FINDINGS: Feeding tube has been placed. The tip is in the fundus of the stomach. No disproportionate dilatation of bowel. No obvious free intraperitoneal gas. IMPRESSION: Feeding tube tip is in the fundus of the stomach. Electronically  Signed   By: Jolaine ClickArthur  Hoss M.D.   On: 01/26/2018 11:43   [place] Ekg Site Rite  Result Date: 01/22/2018 If Site Rite image not attached, placement could not be confirmed due to current  cardiac rhythm.   Microbiology Recent Results (from the past 240 hour(s))  MRSA PCR Screening     Status: None   Collection Time: 01/22/18 10:52 AM  Result Value Ref Range Status   MRSA by PCR NEGATIVE NEGATIVE Final    Comment:        The GeneXpert MRSA Assay (FDA approved for NASAL specimens only), is one component of a comprehensive MRSA colonization surveillance program. It is not intended to diagnose MRSA infection nor to guide or monitor treatment for MRSA infections. Performed at [facility], [address] [place], [place] [address]     Lab Basic Metabolic Panel: Recent Labs  Lab 01/25/18 1000 01/26/18 0002 01/27/18 0503 01/27/18 2126 01/28/18 0406 01/29/18 0427  NA 135 134* 136 137 139 137  K 5.1 5.1 4.7 5.0 5.0 5.6*  CL 107 108 109 111 112* 110  CO2 17* 17* 17* 19* 18* 20*  GLUCOSE 149* 151* 205* 180* 158* 159*  BUN 96* 100* 104* 115* 113* 137*  CREATININE 3.44* 3.45* 3.35* 3.29* 3.10* 3.18*  CALCIUM 8.0* 7.8* 7.9* 8.1* 8.0* 8.3*  MG  --   --   --  2.3  --   --   PHOS 6.7* 6.5* 5.9* 6.1*  --   --    Liver Function Tests: Recent Labs  Lab 01/25/18 1000 01/26/18 0002 01/27/18 0503 01/27/18 2126 01/29/18 0427  AST  --   --   --  115* 94*  ALT  --   --   --  47* 42  ALKPHOS  --   --   --  52 55  BILITOT  --   --   --  0.5 0.6  PROT  --   --   --  4.7* 4.6*  ALBUMIN 2.4* 2.6* 2.3* 2.2* 2.0*   No results for input(s): LIPASE, AMYLASE in the last 168 hours. No results for input(s): AMMONIA in the last 168 hours. CBC: Recent Labs  Lab 01/26/18 1332 01/27/18 2126 01/28/18 0406 01/29/18 0427  WBC 22.3* 19.3* 26.5* 31.6*  NEUTROABS 20.9*  --   --  28.3*  HGB 13.3 12.4 13.7 13.5  HCT 39.5 37.6 40.2 40.3  MCV 96.8 98.9 97.1 97.3  PLT 115* 98* PLATELET  CLUMPS NOTED ON SMEAR, UNABLE TO ESTIMATE PLATELET CLUMPS NOTED ON SMEAR, UNABLE TO ESTIMATE   Cardiac Enzymes: No results for input(s): CKTOTAL, CKMB, CKMBINDEX, TROPONINI in the last 168 hours. Sepsis Labs: Recent Labs  Lab 01/26/18 1332 01/27/18 2126 01/28/18 0406 01/28/18 1129 01/29/18 0427  WBC 22.3* 19.3* 26.5*  --  31.6*  LATICACIDVEN  --   --   --  1.1  --     Procedures/Operations  01/22/18>>Right femoral CVC        Harlon Ditty, AGACNP-BC [facility] Pager: [phone] Cell: [phone]   Judithe Modest 2018/02/11, 8:19 AM

## 2018-02-23 NOTE — Progress Notes (Signed)
Nutrition Brief Follow-Up Note  Chart reviewed. Patient has now transitioned to comfort care.   No further nutrition interventions warranted at this time. Please re-consult RD as needed.   Heron Pitcock, MS, RD, LDN Office: [phone] Pager: [phone] After Hours/Weekend Pager: [phone]    

## 2018-02-23 DEATH — deceased

## 2018-04-25 DIAGNOSIS — Z515 Encounter for palliative care: Secondary | ICD-10-CM

## 2018-04-25 DIAGNOSIS — Z7189 Other specified counseling: Secondary | ICD-10-CM

## 2018-04-25 DIAGNOSIS — J9621 Acute and chronic respiratory failure with hypoxia: Secondary | ICD-10-CM

## 2018-04-25 NOTE — Consult Note (Addendum)
Consultation Note Date: 04/25/2018   Patient Name: Christine Mcclain  DOB: 03/11/1940  MRN: [id number]  Age / Sex: 78 y.o., female  PCP: Hillery Aldo, MD Referring Physician: No att. providers found  Reason for Consultation: Establishing goals of care  HPI/Patient Profile: 78 y.o. female  with past medical history of COPD (chronic 2L oxygen), HTN, HLD, arthritis admitted on 01/05/2018 with abd pain and colitis. Hospitalization complicated by continued decline with RLL pneumonia, acute renal failure, CHF (EF 30%). She continues on HFNC and pressors.   Clinical Assessment and Goals of Care: I have spoken with 4 children and grandchildren at bedside (and separately to husband via telephone). All agree that Christine Mcclain would not want to live this way. We discussed plan for comfort care with liberalized medication for comfort, d/c meds/infusions not adding to comfort, d/c NGT, and do not escalate care. Will keep HFNC at this time but will not increase. We did discuss titrating down oxygen as a measure of comfort as well. I believe prognosis is poor and could live hours to days even with HFNC. Prognosis is extremely poor - family aware.   Primary Decision Maker NEXT OF KIN husband    SUMMARY OF RECOMMENDATIONS   - Comfort care  Code Status/Advance Care Planning:  DNR   Symptom Management:   PRN medications added to ensure comfort. May titrate to opioid infusion as needed to achieve comfort.   Palliative Prophylaxis:   Frequent Pain Assessment, Oral Care and Turn Reposition  Additional Recommendations (Limitations, Scope, Preferences):  Full Comfort Care  Psycho-social/Spiritual:   Desire for further Chaplaincy support:yes  Additional Recommendations: Caregiving  Support/Resources and Grief/Bereavement Support  Prognosis:   Hours - Days  Discharge Planning: Anticipated Hospital Death      Primary  Diagnoses: Present on Admission: . Acute colitis   I have reviewed the medical record, interviewed the patient and family, and examined the patient. The following aspects are pertinent.  Past Medical History:  Diagnosis Date  . Arthritis   . Asthma   . COPD (chronic obstructive pulmonary disease) (HCC)   . Hyperlipidemia   . Hypertension    Social History   Socioeconomic History  . Marital status: Married    Spouse name: Not on file  . Number of children: Not on file  . Years of education: Not on file  . Highest education level: Not on file  Occupational History  . Not on file  Social Needs  . Financial resource strain: Not on file  . Food insecurity:    Worry: Not on file    Inability: Not on file  . Transportation needs:    Medical: Not on file    Non-medical: Not on file  Tobacco Use  . Smoking status: Current Every Day Smoker    Packs/day: 1.50    Years: 62.00    Pack years: 93.00    Types: Cigarettes  . Smokeless tobacco: Never Used  Substance and Sexual Activity  . Alcohol use: No  . Drug use: No  .  Sexual activity: Not on file  Lifestyle  . Physical activity:    Days per week: Not on file    Minutes per session: Not on file  . Stress: Not on file  Relationships  . Social connections:    Talks on phone: Not on file    Gets together: Not on file    Attends religious service: Not on file    Active member of club or organization: Not on file    Attends meetings of clubs or organizations: Not on file    Relationship status: Not on file  Other Topics Concern  . Not on file  Social History Narrative  . Not on file   Family History  Problem Relation Age of Onset  . Diabetes Mother   . Hyperlipidemia Father   . Hypertension Father   . Heart disease Father   . Emphysema Sister    Scheduled Meds: Continuous Infusions: PRN Meds:. Allergies  Allergen Reactions  . Gabapentin Hives  . Naproxen Hives  . Penicillins Hives    Has patient had a PCN  reaction causing immediate rash, facial/tongue/throat swelling, SOB or lightheadedness with hypotension: No Has patient had a PCN reaction causing severe rash involving mucus membranes or skin necrosis: No Has patient had a PCN reaction that required hospitalization: No Has patient had a PCN reaction occurring within the last 10 years: No If all of the above answers are "NO", then may proceed with Cephalosporin use.  . Codeine Rash   Review of Systems  Unable to perform ROS: Acuity of condition    Physical Exam Vitals signs and nursing note reviewed.  Constitutional:      Appearance: She is ill-appearing.  Cardiovascular:     Rate and Rhythm: Tachycardia present. Rhythm irregularly irregular.  Pulmonary:     Effort: Tachypnea, accessory muscle usage and respiratory distress present.  Neurological:     Mental Status: She is unresponsive.     Vital Signs: BP (!) 107/51   Pulse 99   Temp 97.7 F (36.5 C) (Axillary)   Resp (!) 24   Ht 5\' 3"  (1.6 m)   Wt 63.1 kg   SpO2 93%   BMI 24.64 kg/m  Pain Scale: CPOT POSS *See Group Information*: S-Acceptable,Sleep, easy to arouse Pain Score: Asleep   SpO2: SpO2: 93 % O2 Device:SpO2: 93 % O2 Flow Rate: .O2 Flow Rate (L/min): 35 L/min  IO: Intake/output summary: No intake or output data in the 24 hours ending 04/25/18 1158  LBM: Last BM Date: 01/23/18 Baseline Weight: Weight: 50.3 kg Most recent weight: Weight: 63.1 kg     Palliative Assessment/Data:   Flowsheet Rows     Most Recent Value  Intake Tab  Referral Department  Pulmonary  Unit at Time of Referral  ICU  Date Notified  01/29/18  Palliative Care Type  New Palliative care  Reason for referral  End of Life Care Assistance  Date of Admission  Feb 10, 2018  Date first seen by Palliative Care  02/12/2018  # of days Palliative referral response time  1 Day(s)  # of days IP prior to Palliative referral  12  Clinical Assessment  Psychosocial & Spiritual Assessment   Palliative Care Outcomes      Time Total: 50 min Greater than 50%  of this time was spent counseling and coordinating care related to the above assessment and plan.  Signed by: Yong Channel, NP Palliative Medicine Team Pager # [phone] (M-F 8a-5p) Team Phone # [phone] (Nights/Weekends)

## 2020-12-30 IMAGING — DX DG ABD PORTABLE 1V
1 series · 1 of 1 positions shown · non-contrast
Comparison: 01/17/2018

CLINICAL DATA: NG tube placement

EXAM:
PORTABLE ABDOMEN - 1 VIEW

[abdomen supine]
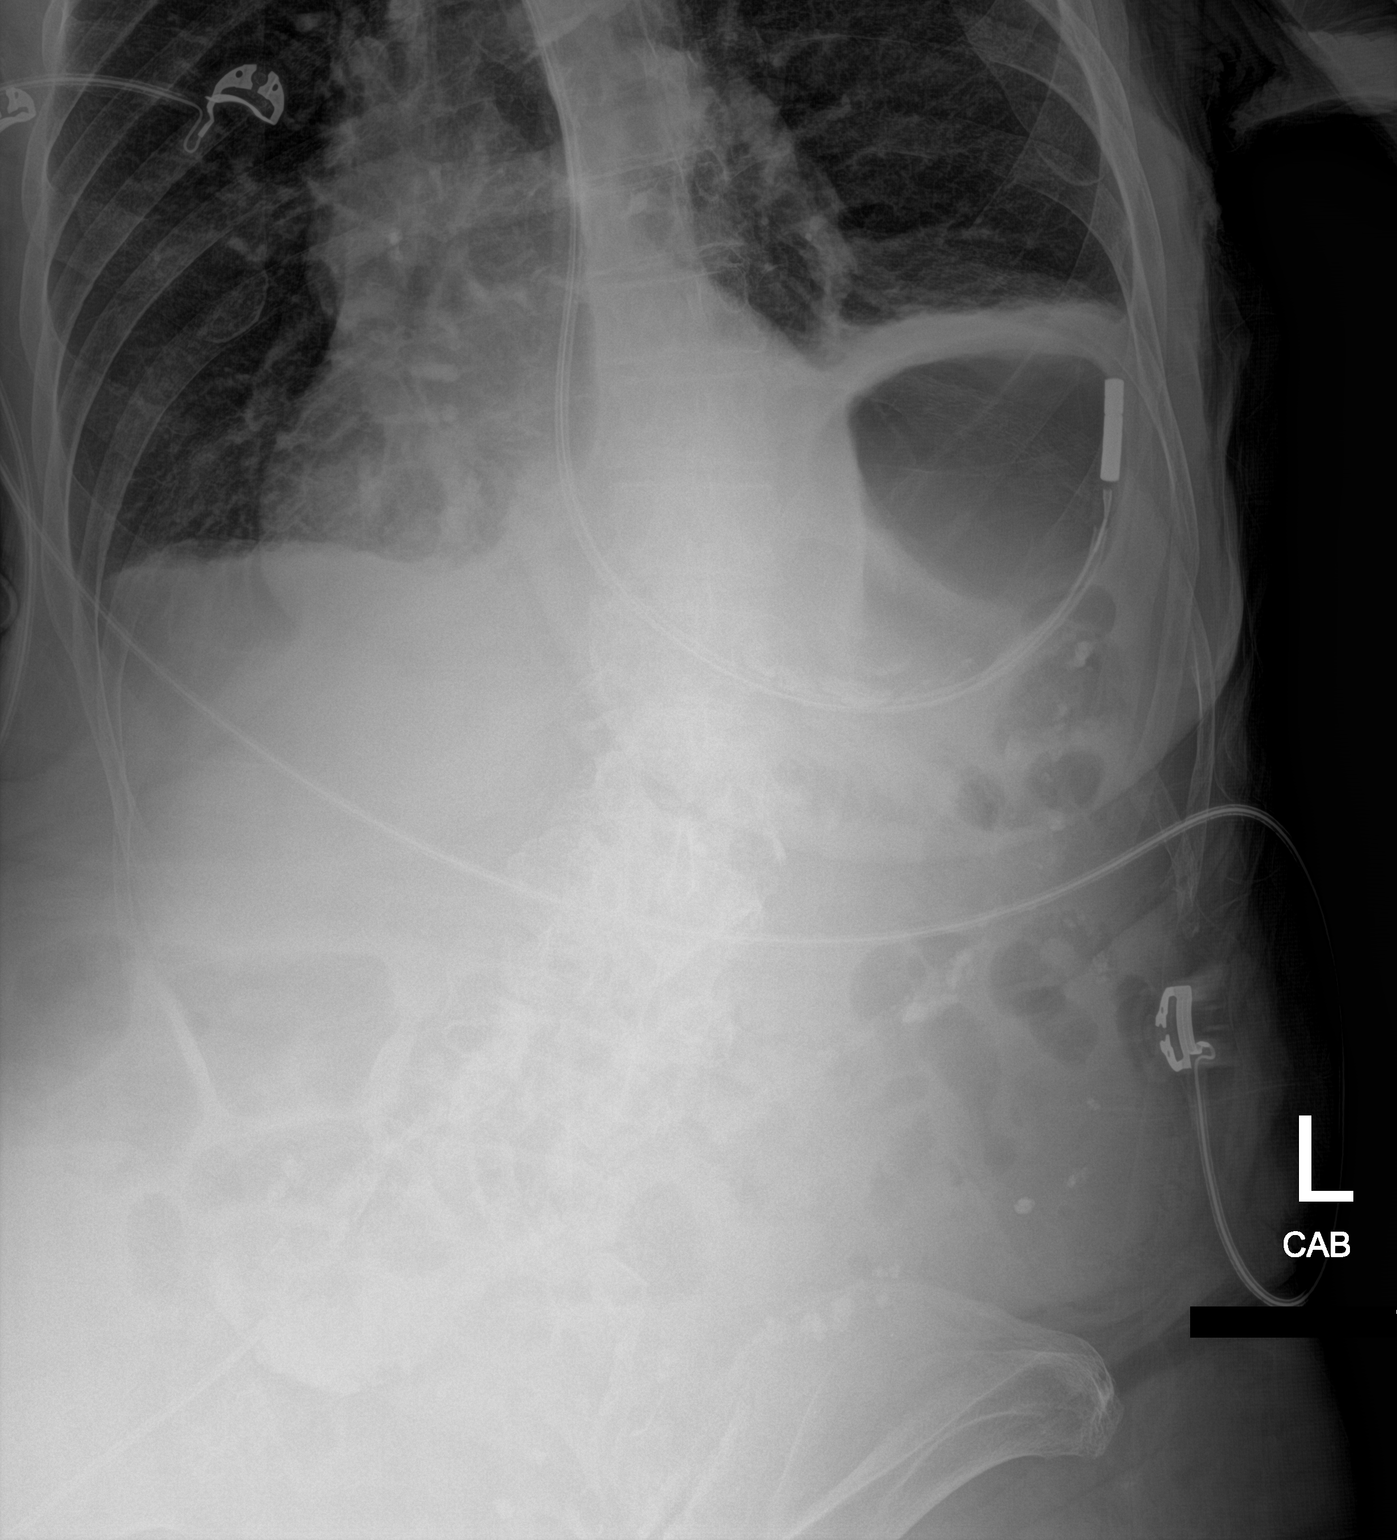

[1 of 1 positions shown; findings below may reference images not displayed]

FINDINGS: Feeding tube has been placed. The tip is in the fundus of the
stomach. No disproportionate dilatation of bowel. No obvious free
intraperitoneal gas.
IMPRESSION: Feeding tube tip is in the fundus of the stomach.
